# Patient Record
Sex: Female | Born: 1960 | Race: White | Hispanic: No | Marital: Married | Smoking: Never smoker
Health system: Southern US, Community
[De-identification: ages and names within clinical notes are randomized; demographics above are authoritative.]

## PROBLEM LIST (undated history)

## (undated) DIAGNOSIS — M1990 Unspecified osteoarthritis, unspecified site: Secondary | ICD-10-CM

## (undated) DIAGNOSIS — H9190 Unspecified hearing loss, unspecified ear: Secondary | ICD-10-CM

## (undated) DIAGNOSIS — Z8041 Family history of malignant neoplasm of ovary: Secondary | ICD-10-CM

## (undated) DIAGNOSIS — Z800 Family history of malignant neoplasm of digestive organs: Secondary | ICD-10-CM

## (undated) DIAGNOSIS — T4145XA Adverse effect of unspecified anesthetic, initial encounter: Secondary | ICD-10-CM

## (undated) DIAGNOSIS — Z87442 Personal history of urinary calculi: Secondary | ICD-10-CM

## (undated) DIAGNOSIS — G4730 Sleep apnea, unspecified: Secondary | ICD-10-CM

## (undated) DIAGNOSIS — C801 Malignant (primary) neoplasm, unspecified: Secondary | ICD-10-CM

## (undated) DIAGNOSIS — Z803 Family history of malignant neoplasm of breast: Secondary | ICD-10-CM

## (undated) DIAGNOSIS — F419 Anxiety disorder, unspecified: Secondary | ICD-10-CM

## (undated) DIAGNOSIS — E785 Hyperlipidemia, unspecified: Secondary | ICD-10-CM

## (undated) DIAGNOSIS — Z8049 Family history of malignant neoplasm of other genital organs: Secondary | ICD-10-CM

## (undated) DIAGNOSIS — T8859XA Other complications of anesthesia, initial encounter: Secondary | ICD-10-CM

## (undated) DIAGNOSIS — F329 Major depressive disorder, single episode, unspecified: Secondary | ICD-10-CM

## (undated) DIAGNOSIS — Z8719 Personal history of other diseases of the digestive system: Secondary | ICD-10-CM

## (undated) DIAGNOSIS — F32A Depression, unspecified: Secondary | ICD-10-CM

## (undated) DIAGNOSIS — R519 Headache, unspecified: Secondary | ICD-10-CM

## (undated) DIAGNOSIS — I10 Essential (primary) hypertension: Secondary | ICD-10-CM

## (undated) DIAGNOSIS — K219 Gastro-esophageal reflux disease without esophagitis: Secondary | ICD-10-CM

## (undated) HISTORY — DX: Sleep apnea, unspecified: G47.30

## (undated) HISTORY — DX: Depression, unspecified: F32.A

## (undated) HISTORY — PX: TUBAL LIGATION: SHX77

## (undated) HISTORY — DX: Family history of malignant neoplasm of ovary: Z80.41

## (undated) HISTORY — DX: Family history of malignant neoplasm of digestive organs: Z80.0

## (undated) HISTORY — PX: COLONOSCOPY: SHX174

## (undated) HISTORY — DX: Major depressive disorder, single episode, unspecified: F32.9

## (undated) HISTORY — PX: CHOLECYSTECTOMY: SHX55

## (undated) HISTORY — DX: Family history of malignant neoplasm of breast: Z80.3

## (undated) HISTORY — PX: OTHER SURGICAL HISTORY: SHX169

## (undated) HISTORY — DX: Essential (primary) hypertension: I10

## (undated) HISTORY — PX: TOTAL ANKLE REPLACEMENT: SUR1218

## (undated) HISTORY — PX: BREAST SURGERY: SHX581

## (undated) HISTORY — DX: Family history of malignant neoplasm of other genital organs: Z80.49

---

## 1995-02-20 HISTORY — PX: CHOLECYSTECTOMY: SHX55

## 2000-09-30 ENCOUNTER — Other Ambulatory Visit: Admission: RE | Admit: 2000-09-30 | Discharge: 2000-09-30 | Payer: Self-pay | Admitting: Obstetrics and Gynecology

## 2001-04-16 ENCOUNTER — Inpatient Hospital Stay (HOSPITAL_COMMUNITY): Admission: AD | Admit: 2001-04-16 | Discharge: 2001-04-20 | Payer: Self-pay | Admitting: Obstetrics and Gynecology

## 2001-04-21 ENCOUNTER — Encounter: Admission: RE | Admit: 2001-04-21 | Discharge: 2001-05-21 | Payer: Self-pay | Admitting: Obstetrics and Gynecology

## 2001-05-20 ENCOUNTER — Other Ambulatory Visit: Admission: RE | Admit: 2001-05-20 | Discharge: 2001-05-20 | Payer: Self-pay | Admitting: Obstetrics and Gynecology

## 2002-12-01 ENCOUNTER — Other Ambulatory Visit: Admission: RE | Admit: 2002-12-01 | Discharge: 2002-12-01 | Payer: Self-pay | Admitting: Obstetrics and Gynecology

## 2003-02-20 HISTORY — PX: NASAL SEPTUM SURGERY: SHX37

## 2003-03-26 ENCOUNTER — Ambulatory Visit (HOSPITAL_COMMUNITY): Admission: RE | Admit: 2003-03-26 | Discharge: 2003-03-26 | Payer: Self-pay | Admitting: Obstetrics and Gynecology

## 2003-11-20 ENCOUNTER — Ambulatory Visit: Payer: Self-pay | Admitting: Internal Medicine

## 2003-12-02 ENCOUNTER — Ambulatory Visit: Payer: Self-pay | Admitting: Otolaryngology

## 2003-12-21 ENCOUNTER — Ambulatory Visit: Payer: Self-pay | Admitting: Internal Medicine

## 2004-01-04 ENCOUNTER — Other Ambulatory Visit: Payer: Self-pay

## 2004-01-12 ENCOUNTER — Ambulatory Visit: Payer: Self-pay | Admitting: Otolaryngology

## 2004-05-18 ENCOUNTER — Ambulatory Visit: Payer: Self-pay | Admitting: Internal Medicine

## 2004-05-20 ENCOUNTER — Ambulatory Visit: Payer: Self-pay | Admitting: Internal Medicine

## 2004-06-29 ENCOUNTER — Ambulatory Visit: Payer: Self-pay | Admitting: Internal Medicine

## 2004-07-07 ENCOUNTER — Ambulatory Visit: Payer: Self-pay | Admitting: Rheumatology

## 2004-07-20 ENCOUNTER — Ambulatory Visit: Payer: Self-pay | Admitting: Internal Medicine

## 2004-08-24 ENCOUNTER — Ambulatory Visit: Payer: Self-pay | Admitting: Family Medicine

## 2004-09-06 ENCOUNTER — Ambulatory Visit: Payer: Self-pay | Admitting: Family Medicine

## 2005-07-09 ENCOUNTER — Other Ambulatory Visit: Payer: Self-pay

## 2005-07-26 ENCOUNTER — Ambulatory Visit: Payer: Self-pay | Admitting: Unknown Physician Specialty

## 2006-10-18 ENCOUNTER — Emergency Department: Payer: Self-pay | Admitting: Emergency Medicine

## 2010-02-19 HISTORY — PX: TOTAL ANKLE ARTHROPLASTY: SHX811

## 2010-10-10 ENCOUNTER — Ambulatory Visit: Payer: Self-pay | Admitting: Internal Medicine

## 2010-10-10 DIAGNOSIS — Z029 Encounter for administrative examinations, unspecified: Secondary | ICD-10-CM

## 2010-11-15 ENCOUNTER — Encounter: Payer: Self-pay | Admitting: Internal Medicine

## 2010-11-28 ENCOUNTER — Ambulatory Visit: Payer: Self-pay | Admitting: Internal Medicine

## 2010-12-01 ENCOUNTER — Ambulatory Visit: Payer: BC Managed Care – PPO | Admitting: Internal Medicine

## 2011-04-09 ENCOUNTER — Encounter: Payer: Self-pay | Admitting: Orthopaedic Surgery

## 2011-04-20 ENCOUNTER — Encounter: Payer: Self-pay | Admitting: Orthopaedic Surgery

## 2011-05-21 ENCOUNTER — Encounter: Payer: Self-pay | Admitting: Orthopaedic Surgery

## 2011-07-02 ENCOUNTER — Encounter: Payer: Self-pay | Admitting: Family Medicine

## 2011-07-02 ENCOUNTER — Ambulatory Visit (INDEPENDENT_AMBULATORY_CARE_PROVIDER_SITE_OTHER): Payer: BC Managed Care – PPO | Admitting: Family Medicine

## 2011-07-02 ENCOUNTER — Other Ambulatory Visit: Payer: Self-pay | Admitting: Family Medicine

## 2011-07-02 ENCOUNTER — Other Ambulatory Visit (HOSPITAL_COMMUNITY)
Admission: RE | Admit: 2011-07-02 | Discharge: 2011-07-02 | Disposition: A | Discharge status: Home / Self Care | Payer: BC Managed Care – PPO | Source: Ambulatory Visit | Attending: Family Medicine | Admitting: Family Medicine

## 2011-07-02 VITALS — BP 120/72 | HR 76 | Temp 98.1°F | Ht 64.25 in | Wt 229.0 lb

## 2011-07-02 DIAGNOSIS — Z1231 Encounter for screening mammogram for malignant neoplasm of breast: Secondary | ICD-10-CM

## 2011-07-02 DIAGNOSIS — Z01419 Encounter for gynecological examination (general) (routine) without abnormal findings: Secondary | ICD-10-CM | POA: Insufficient documentation

## 2011-07-02 DIAGNOSIS — F329 Major depressive disorder, single episode, unspecified: Secondary | ICD-10-CM

## 2011-07-02 DIAGNOSIS — I10 Essential (primary) hypertension: Secondary | ICD-10-CM

## 2011-07-02 DIAGNOSIS — Z1211 Encounter for screening for malignant neoplasm of colon: Secondary | ICD-10-CM

## 2011-07-02 DIAGNOSIS — Z0000 Encounter for general adult medical examination without abnormal findings: Secondary | ICD-10-CM | POA: Insufficient documentation

## 2011-07-02 DIAGNOSIS — D126 Benign neoplasm of colon, unspecified: Secondary | ICD-10-CM

## 2011-07-02 DIAGNOSIS — E119 Type 2 diabetes mellitus without complications: Secondary | ICD-10-CM

## 2011-07-02 DIAGNOSIS — K635 Polyp of colon: Secondary | ICD-10-CM | POA: Insufficient documentation

## 2011-07-02 DIAGNOSIS — Z136 Encounter for screening for cardiovascular disorders: Secondary | ICD-10-CM

## 2011-07-02 DIAGNOSIS — F32A Depression, unspecified: Secondary | ICD-10-CM | POA: Insufficient documentation

## 2011-07-02 LAB — LIPID PANEL
Cholesterol: 181 mg/dL (ref 0–200)
Total CHOL/HDL Ratio: 5
Triglycerides: 462 mg/dL — ABNORMAL HIGH (ref 0.0–149.0)
VLDL: 92.4 mg/dL — ABNORMAL HIGH (ref 0.0–40.0)

## 2011-07-02 LAB — COMPREHENSIVE METABOLIC PANEL
Alkaline Phosphatase: 72 U/L (ref 39–117)
BUN: 16 mg/dL (ref 6–23)
Glucose, Bld: 135 mg/dL — ABNORMAL HIGH (ref 70–99)
Total Bilirubin: 0.5 mg/dL (ref 0.3–1.2)

## 2011-07-02 LAB — LDL CHOLESTEROL, DIRECT: Direct LDL: 85.7 mg/dL

## 2011-07-02 MED ORDER — VENLAFAXINE HCL ER 150 MG PO CP24: 150.0000 mg | ORAL_CAPSULE | Freq: Every day | ORAL | Status: DC

## 2011-07-02 MED ORDER — METFORMIN HCL 1000 MG PO TABS: 500.0000 mg | ORAL_TABLET | Freq: Two times a day (BID) | ORAL | Status: DC

## 2011-07-02 MED ORDER — FENOFIBRATE 48 MG PO TABS: 48.0000 mg | ORAL_TABLET | Freq: Every day | ORAL | Status: DC

## 2011-07-02 NOTE — Progress Notes (Signed)
Subjective:    Patient ID: Sheena Ruiz, female    DOB: 1960-11-22, 51 y.o.   MRN: [id number]  HPI  81 G3P2 yo here to establish care and for CPX.  DM- has been on Metformin 500 mg twice daily for several years.  Has not checked her CBGs in months.  Denies any symptoms of hypoglycemia.  Not sure when her last a1c was checked.  HTN- has been stable on Benicar HCT 20-12.5 mg daily. Denies any HA, blurred vision, LE edema, HA or SOB.  Depression- stable on Effexor (brand only)- 150 mg daily. Denies any symptoms of anxiety or depression.  Colon polyps- per pt last colonoscopy was in 2008 or 2009.  Due for follow up colonoscopy.  Patient Active Problem List  Diagnoses  . Diabetes mellitus  . Depression  . Hypertension  . Colon polyps   Past Medical History  Diagnosis Date  . Diabetes mellitus   . Depression   . Hypertension    No past surgical history on file. History  Substance Use Topics  . Smoking status: Never Smoker   . Smokeless tobacco: Not on file  . Alcohol Use: Not on file   No family history on file. Allergies  Allergen Reactions  . Penicillins    Current Outpatient Prescriptions on File Prior to Visit  Medication Sig Dispense Refill  . metFORMIN (GLUCOPHAGE) 500 MG tablet Take 500 mg by mouth 2 (two) times daily with a meal.      . olmesartan-hydrochlorothiazide (BENICAR HCT) 20-12.5 MG per tablet Take 1 tablet by mouth daily.      Marland Kitchen venlafaxine XR (EFFEXOR-XR) 150 MG 24 hr capsule Take 1 capsule (150 mg total) by mouth daily. Name brand only  30 capsule  11   The PMH, PSH, Social History, Family History, Medications, and allergies have been reviewed in [facility], and have been updated if relevant.   Review of Systems See HPI Patient reports no  vision/ hearing changes,anorexia, weight change, fever ,adenopathy, persistant / recurrent hoarseness, swallowing issues, chest pain, edema,persistant / recurrent cough, hemoptysis, dyspnea(rest, exertional,  paroxysmal nocturnal), gastrointestinal  bleeding (melena, rectal bleeding), abdominal pain, excessive heart burn, GU symptoms(dysuria, hematuria, pyuria, voiding/incontinence  Issues) syncope, focal weakness, severe memory loss, concerning skin lesions, depression, anxiety, abnormal bruising/bleeding, major joint swelling, breast masses or abnormal vaginal bleeding.       Objective:   Physical Exam BP 120/72  Pulse 76  Temp(Src) 98.1 F (36.7 C) (Oral)  Ht 5' 4.25" (1.632 m)  Wt 229 lb (103.874 kg)  BMI 39.00 kg/m2  LMP 03/06/2011  General:  Well-developed,well-nourished,in no acute distress; alert,appropriate and cooperative throughout examination Head:  normocephalic and atraumatic.   Eyes:  vision grossly intact, pupils equal, pupils round, and pupils reactive to light.   Ears:  R ear normal and L ear normal.   Nose:  no external deformity.   Mouth:  good dentition.   Neck:  No deformities, masses, or tenderness noted. Breasts:  No mass, nodules, thickening, tenderness, bulging, retraction, inflamation, nipple discharge or skin changes noted.   Lungs:  Normal respiratory effort, chest expands symmetrically. Lungs are clear to auscultation, no crackles or wheezes. Heart:  Normal rate and regular rhythm. S1 and S2 normal without gallop, murmur, click, rub or other extra sounds. Abdomen:  Bowel sounds positive,abdomen soft and non-tender without masses, organomegaly or hernias noted. Rectal:  no external abnormalities.   Genitalia:  Pelvic Exam:        External: normal female genitalia  without lesions or masses        Vagina: normal without lesions or masses        Cervix: normal without lesions or masses        Adnexa: normal bimanual exam without masses or fullness        Uterus: normal by palpation        Pap smear: performed Msk:  No deformity or scoliosis noted of thoracic or lumbar spine.   Extremities:  No clubbing, cyanosis, edema, or deformity noted with normal full range  of motion of all joints.   Neurologic:  alert & oriented X3 and gait normal.   Skin:  Intact without suspicious lesions or rashes Cervical Nodes:  No lymphadenopathy noted Axillary Nodes:  No palpable lymphadenopathy Psych:  Cognition and judgment appear intact. Alert and cooperative with normal attention span and concentration. No apparent delusions, illusions, hallucinations     Assessment & Plan:   1. Hypertension  Stable on current dose of Benicar. Comprehensive metabolic panel   2. Diabetes mellitus    Advised checking CBGs a couple of times a week.  Check a1c today. Hemoglobin A1c  3. Depression  Stable, refilled Effexor.   4. Routine general medical examination at a health care facility   Reviewed preventive care protocols, scheduled due services, and updated immunizations Discussed nutrition, exercise, diet, and healthy lifestyle.  MM Digital Screening Pap smear Lipid Panel

## 2011-07-02 NOTE — Patient Instructions (Signed)
It was great to meet you. Please stop by to see Shirlee Limerick on your way out to set up your mammogram and colonoscopy. We will call with results of your blood work from today.

## 2011-07-05 ENCOUNTER — Encounter: Payer: Self-pay | Admitting: Family Medicine

## 2011-08-14 ENCOUNTER — Ambulatory Visit: Payer: Self-pay | Admitting: Family Medicine

## 2011-08-15 ENCOUNTER — Encounter: Payer: Self-pay | Admitting: Family Medicine

## 2011-08-16 ENCOUNTER — Encounter: Payer: Self-pay | Admitting: Family Medicine

## 2011-08-16 ENCOUNTER — Encounter: Payer: Self-pay | Admitting: *Deleted

## 2011-08-29 ENCOUNTER — Ambulatory Visit: Payer: BC Managed Care – PPO | Admitting: Family Medicine

## 2011-09-27 ENCOUNTER — Other Ambulatory Visit: Payer: Self-pay | Admitting: Family Medicine

## 2011-09-27 DIAGNOSIS — E119 Type 2 diabetes mellitus without complications: Secondary | ICD-10-CM

## 2011-09-27 DIAGNOSIS — E7800 Pure hypercholesterolemia, unspecified: Secondary | ICD-10-CM

## 2011-09-27 DIAGNOSIS — I10 Essential (primary) hypertension: Secondary | ICD-10-CM

## 2011-10-04 ENCOUNTER — Encounter: Payer: Self-pay | Admitting: Family Medicine

## 2011-10-04 ENCOUNTER — Other Ambulatory Visit: Payer: BC Managed Care – PPO

## 2011-12-29 ENCOUNTER — Emergency Department: Payer: Self-pay | Admitting: Emergency Medicine

## 2012-04-23 ENCOUNTER — Telehealth: Payer: Self-pay | Admitting: Family Medicine

## 2012-04-23 NOTE — Telephone Encounter (Signed)
Lupita Leash left vm for pt to return call, pt to schedule an appt just to discuss meds and preventative services per Dr. Dan Humphreys.

## 2012-05-07 NOTE — Telephone Encounter (Signed)
Not a Dr. Dan Humphreys patient

## 2012-07-11 ENCOUNTER — Other Ambulatory Visit: Payer: Self-pay

## 2012-07-11 MED ORDER — VENLAFAXINE HCL ER 150 MG PO CP24: 150.0000 mg | ORAL_CAPSULE | Freq: Every day | ORAL | Status: DC

## 2012-07-11 NOTE — Telephone Encounter (Signed)
Sheena Ruiz with [employer] left v/m requesting refill venlafaxine XR 150 mg (pt refills has expired). Pt out of med. Pt last seen 07/02/11; pt has CPX scheduled 08/07/12.Please advise.

## 2012-08-04 ENCOUNTER — Other Ambulatory Visit: Payer: BC Managed Care – PPO

## 2012-08-07 ENCOUNTER — Encounter: Payer: BC Managed Care – PPO | Admitting: Family Medicine

## 2012-08-08 ENCOUNTER — Other Ambulatory Visit: Payer: Self-pay | Admitting: Family Medicine

## 2012-08-08 NOTE — Telephone Encounter (Signed)
Pt left v/m to ck status of refill; advised via v/m refill done.

## 2012-10-16 ENCOUNTER — Encounter: Payer: BC Managed Care – PPO | Admitting: Family Medicine

## 2012-10-17 ENCOUNTER — Ambulatory Visit: Payer: BC Managed Care – PPO | Admitting: Family Medicine

## 2012-10-23 ENCOUNTER — Ambulatory Visit (INDEPENDENT_AMBULATORY_CARE_PROVIDER_SITE_OTHER): Payer: BC Managed Care – PPO | Admitting: Family Medicine

## 2012-10-23 ENCOUNTER — Encounter: Payer: Self-pay | Admitting: Family Medicine

## 2012-10-23 VITALS — BP 122/60 | HR 89 | Temp 98.5°F | Ht 64.25 in | Wt 222.0 lb

## 2012-10-23 DIAGNOSIS — E781 Pure hyperglyceridemia: Secondary | ICD-10-CM

## 2012-10-23 DIAGNOSIS — E785 Hyperlipidemia, unspecified: Secondary | ICD-10-CM | POA: Insufficient documentation

## 2012-10-23 DIAGNOSIS — Z1231 Encounter for screening mammogram for malignant neoplasm of breast: Secondary | ICD-10-CM

## 2012-10-23 DIAGNOSIS — E119 Type 2 diabetes mellitus without complications: Secondary | ICD-10-CM

## 2012-10-23 DIAGNOSIS — I10 Essential (primary) hypertension: Secondary | ICD-10-CM

## 2012-10-23 DIAGNOSIS — F329 Major depressive disorder, single episode, unspecified: Secondary | ICD-10-CM

## 2012-10-23 LAB — COMPREHENSIVE METABOLIC PANEL
AST: 22 U/L (ref 0–37)
Alkaline Phosphatase: 78 U/L (ref 39–117)
BUN: 11 mg/dL (ref 6–23)
Creatinine, Ser: 0.6 mg/dL (ref 0.4–1.2)
Total Bilirubin: 0.5 mg/dL (ref 0.3–1.2)

## 2012-10-23 LAB — LIPID PANEL
Cholesterol: 207 mg/dL — ABNORMAL HIGH (ref 0–200)
Total CHOL/HDL Ratio: 6
Triglycerides: 495 mg/dL — ABNORMAL HIGH (ref 0.0–149.0)
VLDL: 99 mg/dL — ABNORMAL HIGH (ref 0.0–40.0)

## 2012-10-23 LAB — LDL CHOLESTEROL, DIRECT: Direct LDL: 98.3 mg/dL

## 2012-10-23 MED ORDER — EFFEXOR XR 150 MG PO CP24: ORAL_CAPSULE | ORAL | Status: DC

## 2012-10-23 NOTE — Progress Notes (Signed)
Subjective:    Patient ID: Sheena Ruiz, female    DOB: November 18, 1960, 52 y.o.   MRN: [id number]  HPI  24 G3P2 yo here for "rx refills."  Has not been seen since she established care over a year ago.  DM- has been on Metformin 1000 mg twice daily since she established care with me in 06/2011.  I advised follow up a1c in 09/2011 but she was lost to follow up.  Has not checked her CBGs in months.  Denies any symptoms of hypoglycemia.  Lab Results  Component Value Date   HGBA1C 8.2* 07/02/2011   Hypertriglyceridemia- also lost to follow up- see Epic.  Given rx for tricor and she states that she stopped taking it because she felt bad.  Cannot remember how it made her feel bad- she thinks GI side effects. Lab Results  Component Value Date   CHOL 181 07/02/2011   HDL 39.10 07/02/2011   LDLDIRECT 85.7 07/02/2011   TRIG 462.0* 07/02/2011   CHOLHDL 5 07/02/2011    HTN- has been stable on Benicar HCT 20-12.5 mg daily. Denies any HA, blurred vision, LE edema, HA or SOB.  Lab Results  Component Value Date   CREATININE 0.8 07/02/2011    Depression- stable on Effexor (brand only)- 150 mg daily. Denies any symptoms of anxiety or depression.   Patient Active Problem List   Diagnosis Date Noted  . Colon polyps 07/02/2011  . Type II or unspecified type diabetes mellitus without mention of complication, not stated as uncontrolled   . Depression   . Hypertension    Past Medical History  Diagnosis Date  . Diabetes mellitus   . Depression   . Hypertension    Past Surgical History  Procedure Laterality Date  . Ankle replacement     History  Substance Use Topics  . Smoking status: Never Smoker   . Smokeless tobacco: Not on file  . Alcohol Use: Not on file   No family history on file. Allergies  Allergen Reactions  . Penicillins    Current Outpatient Prescriptions on File Prior to Visit  Medication Sig Dispense Refill  . EFFEXOR XR 150 MG 24 hr capsule take 1 capsule by mouth daily   30 capsule  2  . fenofibrate (TRICOR) 48 MG tablet Take 1 tablet (48 mg total) by mouth daily.  30 tablet  3  . metFORMIN (GLUCOPHAGE) 1000 MG tablet Take 0.5 tablets (500 mg total) by mouth 2 (two) times daily with a meal.  60 tablet  3  . Multiple Vitamin (MULTIVITAMIN) tablet Take 1 tablet by mouth daily.      Marland Kitchen olmesartan-hydrochlorothiazide (BENICAR HCT) 20-12.5 MG per tablet Take 1 tablet by mouth daily.       No current facility-administered medications on file prior to visit.   The PMH, PSH, Social History, Family History, Medications, and allergies have been reviewed in [facility], and have been updated if relevant.   Review of Systems See HPI     Objective:   Physical Exam BP 122/60  Pulse 89  Temp(Src) 98.5 F (36.9 C)  Ht 5' 4.25" (1.632 m)  Wt 222 lb (100.699 kg)  BMI 37.81 kg/m2  LMP 03/06/2011  General:  Well-developed,well-nourished,in no acute distress; alert,appropriate and cooperative throughout examination Head:  normocephalic and atraumatic.   Eyes:  vision grossly intact, pupils equal, pupils round, and pupils reactive to light.   Neurologic:  alert & oriented X3 and gait normal.   Skin:  Intact without suspicious  lesions or rashes Psych:  Cognition and judgment appear intact. Alert and cooperative with normal attention span and concentration. No apparent delusions, illusions, hallucinations     Assessment & Plan:   1. Hypertension Stable. - Lipid Panel - Comprehensive metabolic panel  2. Type II or unspecified type diabetes mellitus without mention of complication, not stated as uncontrolled Lost to follow up.  Recheck a1c today. - Hemoglobin A1c  3. Depression Stable on current dose of Effexor.  Rx refilled.  4. Hypertriglyceridemia Recheck lipids today.   - Lipid Panel  5. Other screening mammogram  - MM Digital Screening; Future

## 2012-10-23 NOTE — Patient Instructions (Addendum)
Good to see you. We will call you with your lab results and you can view them online.  Ok to use two acute slots to book a complete physical before my maternity leave.

## 2012-10-29 ENCOUNTER — Other Ambulatory Visit: Payer: BC Managed Care – PPO

## 2012-11-03 ENCOUNTER — Ambulatory Visit (INDEPENDENT_AMBULATORY_CARE_PROVIDER_SITE_OTHER): Payer: BC Managed Care – PPO | Admitting: Family Medicine

## 2012-11-03 ENCOUNTER — Encounter: Payer: Self-pay | Admitting: Family Medicine

## 2012-11-03 VITALS — BP 140/74 | HR 76 | Temp 98.2°F | Ht 64.25 in | Wt 222.0 lb

## 2012-11-03 DIAGNOSIS — E119 Type 2 diabetes mellitus without complications: Secondary | ICD-10-CM

## 2012-11-03 DIAGNOSIS — I10 Essential (primary) hypertension: Secondary | ICD-10-CM

## 2012-11-03 DIAGNOSIS — D126 Benign neoplasm of colon, unspecified: Secondary | ICD-10-CM

## 2012-11-03 DIAGNOSIS — F329 Major depressive disorder, single episode, unspecified: Secondary | ICD-10-CM

## 2012-11-03 DIAGNOSIS — E781 Pure hyperglyceridemia: Secondary | ICD-10-CM

## 2012-11-03 DIAGNOSIS — Z0000 Encounter for general adult medical examination without abnormal findings: Secondary | ICD-10-CM | POA: Insufficient documentation

## 2012-11-03 DIAGNOSIS — K635 Polyp of colon: Secondary | ICD-10-CM

## 2012-11-03 NOTE — Patient Instructions (Addendum)
Good to see you. Today we discussed either restarting Metformin or starting Glyburide or Glipizide.  For your triglycerides, we discussed Tricor or fenofibrate.  We will call you with your nutritionist referral.  Please call me to decide on which medications you would like to start.

## 2012-11-03 NOTE — Progress Notes (Signed)
Subjective:    Patient ID: Sheena Ruiz, female    DOB: 1960/06/24, 52 y.o.   MRN: [id number]  HPI  89 G3P2 yo here for CPX.    Mammogram scheduled.  Pap smear was normal last year - 06/2011.  DM- poorly controlled.   Has been on Metformin 1000 mg twice daily since she established care with me in 06/2011 but admits to not taking it.  Did cause some cramping and lose stools.   Lab Results  Component Value Date   HGBA1C 9.6* 10/23/2012   Hypertriglyceridemia-.  Given rx for tricor and she states that she stopped taking it because she felt bad.  Cannot remember how it made her feel bad- she thinks GI side effects. Lab Results  Component Value Date   CHOL 207* 10/23/2012   HDL 34.50* 10/23/2012   LDLDIRECT 98.3 10/23/2012   TRIG 495.0 Triglyceride is over 400; calculations on Lipids are invalid.* 10/23/2012   CHOLHDL 6 10/23/2012    HTN- has been stable on Benicar HCT 20-12.5 mg daily. Denies any HA, blurred vision, LE edema, HA or SOB.  Lab Results  Component Value Date   CREATININE 0.6 10/23/2012   Depression- stable on Effexor (brand only)- 150 mg daily. Denies any symptoms of anxiety or depression.   Patient Active Problem List   Diagnosis Date Noted  . Routine general medical examination at a health care facility 11/03/2012  . Hypertriglyceridemia 10/23/2012  . Colon polyps 07/02/2011  . Type II or unspecified type diabetes mellitus without mention of complication, not stated as uncontrolled   . Depression   . Hypertension    Past Medical History  Diagnosis Date  . Diabetes mellitus   . Depression   . Hypertension    Past Surgical History  Procedure Laterality Date  . Ankle replacement     History  Substance Use Topics  . Smoking status: Never Smoker   . Smokeless tobacco: Not on file  . Alcohol Use: Not on file   No family history on file. Allergies  Allergen Reactions  . Penicillins    Current Outpatient Prescriptions on File Prior to Visit  Medication Sig  Dispense Refill  . EFFEXOR XR 150 MG 24 hr capsule take 1 capsule by mouth daily  30 capsule  2  . fenofibrate (TRICOR) 48 MG tablet Take 1 tablet (48 mg total) by mouth daily.  30 tablet  3  . metFORMIN (GLUCOPHAGE) 1000 MG tablet Take 0.5 tablets (500 mg total) by mouth 2 (two) times daily with a meal.  60 tablet  3  . Multiple Vitamin (MULTIVITAMIN) tablet Take 1 tablet by mouth daily.      Marland Kitchen olmesartan-hydrochlorothiazide (BENICAR HCT) 20-12.5 MG per tablet Take 1 tablet by mouth daily.       No current facility-administered medications on file prior to visit.   The PMH, PSH, Social History, Family History, Medications, and allergies have been reviewed in [facility], and have been updated if relevant.   Review of Systems See HPI Patient reports no  vision/ hearing changes,anorexia, weight change, fever ,adenopathy, persistant / recurrent hoarseness, swallowing issues, chest pain, edema,persistant / recurrent cough, hemoptysis, dyspnea(rest, exertional, paroxysmal nocturnal), gastrointestinal  bleeding (melena, rectal bleeding), abdominal pain, excessive heart burn, GU symptoms(dysuria, hematuria, pyuria, voiding/incontinence  Issues) syncope, focal weakness, severe memory loss, concerning skin lesions, depression, anxiety, abnormal bruising/bleeding, major joint swelling, breast masses or abnormal vaginal bleeding.       Objective:   Physical Exam LMP 03/06/2011 BP 140/74  Pulse 76  Temp(Src) 98.2 F (36.8 C)  Ht 5' 4.25" (1.632 m)  Wt 222 lb (100.699 kg)  BMI 37.81 kg/m2  LMP 03/06/2011 Wt Readings from Last 3 Encounters:  11/03/12 222 lb (100.699 kg)  10/23/12 222 lb (100.699 kg)  07/02/11 229 lb (103.874 kg)    General:  Well-developed,well-nourished,in no acute distress; alert,appropriate and cooperative throughout examination Head:  normocephalic and atraumatic.   Eyes:  vision grossly intact, pupils equal, pupils round, and pupils reactive to light.   Ears:  R ear normal and L  ear normal.   Nose:  no external deformity.   Mouth:  good dentition.   lungs:  Normal respiratory effort, chest expands symmetrically. Lungs are clear to auscultation, no crackles or wheezes. Heart:  Normal rate and regular rhythm. S1 and S2 normal without gallop, murmur, click, rub or other extra sounds. Msk:  No deformity or scoliosis noted of thoracic or lumbar spine.   Extremities:  No clubbing, cyanosis, edema, or deformity noted with normal full range of motion of all joints.   Neurologic:  alert & oriented X3 and gait normal.   Skin:  Intact without suspicious lesions or rashes Cervical Nodes:  No lymphadenopathy noted Axillary Nodes:  No palpable lymphadenopathy Psych:  Cognition and judgment appear intact. Alert and cooperative with normal attention span and concentration. No apparent delusions, illusions, hallucinations   Diabetic foot exam: Normal inspection No skin breakdown No calluses  Normal DP pulses Normal sensation to light touch and monofilament Nails normal     Assessment & Plan:   1. Routine general medical examination at a health care facility Reviewed preventive care protocols, scheduled due services, and updated immunizations Discussed nutrition, exercise, diet, and healthy lifestyle.   2. Colon polyps UTD colonoscopy. Denies changes in bowel habits. 3. Depression Stable on current dose of effexor.  No changes. 4. Hypertension Stable on Benicar. 5. Hypertriglyceridemia Deteriorated- fasting labs done at husbands office, TG remained high at 496.  Did not tolerate Tricor. Discussed if she restarted Metformin and changed her diet, we could improve her TG. Referred to diabetic nutritionist. The patient indicates understanding of these issues and agrees with the plan.   6. Type II or unspecified type diabetes mellitus without mention of complication, not stated as uncontrolled Deteriorated. Discussed tx options.  She will discuss with her husband who  is an MD and get back to me.

## 2012-11-18 ENCOUNTER — Ambulatory Visit
Admission: RE | Admit: 2012-11-18 | Discharge: 2012-11-18 | Disposition: A | Discharge status: Home / Self Care | Payer: BC Managed Care – PPO | Source: Ambulatory Visit | Attending: Family Medicine | Admitting: Family Medicine

## 2012-11-18 DIAGNOSIS — Z1231 Encounter for screening mammogram for malignant neoplasm of breast: Secondary | ICD-10-CM

## 2012-12-05 ENCOUNTER — Telehealth: Payer: Self-pay | Admitting: Family Medicine

## 2012-12-05 NOTE — Telephone Encounter (Signed)
Pt did not state why she called. She wanted Dr. Dayton Martes or Dr. Elmer Sow assistant to call her back.

## 2012-12-05 NOTE — Telephone Encounter (Signed)
Pt cancelled 12/11/12 appt and pt was to let Dr Dayton Martes know what diabetic med she wanted to take; pt said BS during the day are below 150 with pt watching her diet. Pt would like to try dieting to see how she does and will cb if needed before her regular 3 month appt.

## 2012-12-08 NOTE — Telephone Encounter (Signed)
Noted and I'm happy she is making lifestyle changes.  Lab Results  Component Value Date   HGBA1C 9.6* 10/23/2012   Given her very elevated a1c though, I would still recommend medication even if only for short term.

## 2012-12-08 NOTE — Telephone Encounter (Signed)
Left message on answering machine to call back.

## 2012-12-09 NOTE — Telephone Encounter (Signed)
Patient notified as instructed by telephone. Was advised by patient that she does not want to take any diabetic medication now. Patient states that her FBS in the am has been running 98 and after a meal 130.

## 2012-12-09 NOTE — Telephone Encounter (Signed)
noted 

## 2012-12-11 ENCOUNTER — Ambulatory Visit: Payer: BC Managed Care – PPO | Admitting: Family Medicine

## 2012-12-17 ENCOUNTER — Telehealth: Payer: Self-pay

## 2012-12-17 NOTE — Telephone Encounter (Signed)
Pt wants to know why she was only given 3 refills on Effexor since she had been on med for a long time. Advised pt [place] policy for prescribing meds for depression and pt wanted to know how often she would be coming in to see Dr Dayton Martes. Pt already has appt to see Dr Dayton Martes in 02/2013 and pt will discuss with Dr Dayton Martes about frequency of visits at that time. If pt needs anything else she will contact our office.

## 2013-02-06 ENCOUNTER — Other Ambulatory Visit: Payer: Self-pay | Admitting: Family Medicine

## 2013-03-02 ENCOUNTER — Ambulatory Visit (INDEPENDENT_AMBULATORY_CARE_PROVIDER_SITE_OTHER): Payer: BC Managed Care – PPO | Admitting: Family Medicine

## 2013-03-02 ENCOUNTER — Encounter: Payer: Self-pay | Admitting: Family Medicine

## 2013-03-02 VITALS — BP 136/66 | HR 90 | Temp 98.7°F | Ht 64.0 in | Wt 202.0 lb

## 2013-03-02 DIAGNOSIS — E119 Type 2 diabetes mellitus without complications: Secondary | ICD-10-CM

## 2013-03-02 DIAGNOSIS — I10 Essential (primary) hypertension: Secondary | ICD-10-CM

## 2013-03-02 DIAGNOSIS — E781 Pure hyperglyceridemia: Secondary | ICD-10-CM

## 2013-03-02 NOTE — Assessment & Plan Note (Signed)
Improved with diet  

## 2013-03-02 NOTE — Patient Instructions (Signed)
I am so proud of you, Sheena Ruiz. Please send me your labs (or fax them) in 3 months. I will call you.

## 2013-03-02 NOTE — Progress Notes (Signed)
Subjective:    Patient ID: Sheena Ruiz, female    DOB: Jun 22, 1960, 53 y.o.   MRN: [id number]  HPI  53 yo female here for 3 month follow up.  She brings in labs with her done at her husband's office.   DM- poorly controlled.   Had been on Metformin 1000 mg twice daily but stopped taking it.  Refused to start another medication.    Wanted to do it with diet alone and she has!!! a1c now 6.9.  She has lost 20 pounds!  CBGs usually 80 fasting.   Lab Results  Component Value Date   HGBA1C 9.6* 10/23/2012   Hypertriglyceridemia-.  Given rx for tricor and she states that she stopped taking it because she felt bad.  Cannot remember how it made her feel bad- she thinks GI side effects. TG now 279!! Lab Results  Component Value Date   CHOL 207* 10/23/2012   HDL 34.50* 10/23/2012   LDLDIRECT 98.3 10/23/2012   TRIG 495.0 Triglyceride is over 400; calculations on Lipids are invalid.* 10/23/2012   CHOLHDL 6 10/23/2012    HTN- has been stable on Benicar HCT 20-12.5 mg daily. Denies any HA, blurred vision, LE edema, HA or SOB.  Lab Results  Component Value Date   CREATININE 0.6 10/23/2012    Patient Active Problem List   Diagnosis Date Noted  . Hypertriglyceridemia 10/23/2012  . Colon polyps 07/02/2011  . Type II or unspecified type diabetes mellitus without mention of complication, not stated as uncontrolled   . Depression   . Hypertension    Past Medical History  Diagnosis Date  . Diabetes mellitus   . Depression   . Hypertension    Past Surgical History  Procedure Laterality Date  . Ankle replacement     History  Substance Use Topics  . Smoking status: Never Smoker   . Smokeless tobacco: Not on file  . Alcohol Use: Not on file   No family history on file. Allergies  Allergen Reactions  . Penicillins   . Tricor [Fenofibrate]     "felt bad and heavy."   Current Outpatient Prescriptions on File Prior to Visit  Medication Sig Dispense Refill  . COCONUT OIL PO Take by mouth.       . EFFEXOR XR 150 MG 24 hr capsule take 1 capsule by mouth daily  30 capsule  2  . EFFEXOR XR 150 MG 24 hr capsule take 1 capsule by mouth once daily  30 capsule  2  . Flaxseed, Linseed, (FLAX SEED OIL PO) Take one by mouth daily      . L-THEANINE PO Take two by mouth daily      . metFORMIN (GLUCOPHAGE) 1000 MG tablet Take 0.5 tablets (500 mg total) by mouth 2 (two) times daily with a meal.  60 tablet  3  . Multiple Vitamin (MULTIVITAMIN) tablet Take 1 tablet by mouth daily.      . Nutritional Supplements (JUICE PLUS FIBRE PO) Take 2 by mouth daily      . olmesartan-hydrochlorothiazide (BENICAR HCT) 20-12.5 MG per tablet Take 1 tablet by mouth daily.      . Plant Sterols and Stanols (CHOLESTOFF) 450 MG TABS Take 2 by mouth three times daily with meals       No current facility-administered medications on file prior to visit.   The PMH, PSH, Social History, Family History, Medications, and allergies have been reviewed in [facility], and have been updated if relevant.   Review of Systems  See HPI Patient reports no  vision/ hearing changes,anorexia, weight change, fever ,adenopathy, persistant / recurrent hoarseness, swallowing issues, chest pain, edema,persistant / recurrent cough, hemoptysis, dyspnea(rest, exertional, paroxysmal nocturnal), gastrointestinal  bleeding (melena, rectal bleeding), abdominal pain, excessive heart burn, GU symptoms(dysuria, hematuria, pyuria, voiding/incontinence  Issues) syncope, focal weakness, severe memory loss, concerning skin lesions, depression, anxiety, abnormal bruising/bleeding, major joint swelling, breast masses or abnormal vaginal bleeding.       Objective:   Physical Exam BP 136/66  Pulse 90  Temp(Src) 98.7 F (37.1 C) (Oral)  Ht 5\' 4"  (1.626 m)  Wt 202 lb (91.627 kg)  BMI 34.66 kg/m2  SpO2 97%  LMP 03/06/2011 Wt Readings from Last 3 Encounters:  03/02/13 202 lb (91.627 kg)  11/03/12 222 lb (100.699 kg)  10/23/12 222 lb (100.699 kg)   General:   Well-developed,well-nourished,in no acute distress; alert,appropriate and cooperative throughout examination Head:  normocephalic and atraumatic.   Eyes:  vision grossly intact, pupils equal, pupils round, and pupils reactive to light.   Ears:  R ear normal and L ear normal.   Nose:  no external deformity.   Mouth:  good dentition.   lungs:  Normal respiratory effort, chest expands symmetrically. Lungs are clear to auscultation, no crackles or wheezes. Heart:  Normal rate and regular rhythm. S1 and S2 normal without gallop, murmur, click, rub or other extra sounds. Msk:  No deformity or scoliosis noted of thoracic or lumbar spine.   Extremities:  No clubbing, cyanosis, edema, or deformity noted with normal full range of motion of all joints.   Neurologic:  alert & oriented X3 and gait normal.   Skin:  Intact without suspicious lesions or rashes Cervical Nodes:  No lymphadenopathy noted Axillary Nodes:  No palpable lymphadenopathy Psych:  Cognition and judgment appear intact. Alert and cooperative with normal attention span and concentration. No apparent delusions, illusions, hallucinations      Assessment & Plan:

## 2013-03-02 NOTE — Assessment & Plan Note (Signed)
Well controlled. No changes. 

## 2013-03-02 NOTE — Assessment & Plan Note (Signed)
At goal with diet alone! She wants to keep losing weight- continue with low fat, low carb diet.  Will increase exercise.

## 2013-03-02 NOTE — Progress Notes (Signed)
Pre-visit discussion using our clinic review tool. No additional management support is needed unless otherwise documented below in the visit note.  

## 2013-03-03 ENCOUNTER — Telehealth: Payer: Self-pay | Admitting: Family Medicine

## 2013-03-03 ENCOUNTER — Telehealth: Payer: Self-pay

## 2013-03-03 NOTE — Telephone Encounter (Signed)
Relevant patient education assigned to patient using Emmi. ° °

## 2013-05-11 ENCOUNTER — Other Ambulatory Visit: Payer: Self-pay | Admitting: *Deleted

## 2013-05-11 NOTE — Telephone Encounter (Signed)
Fax refill request, Rout to PCP 

## 2013-05-12 MED ORDER — EFFEXOR XR 150 MG PO CP24: ORAL_CAPSULE | ORAL | Status: DC

## 2013-05-28 ENCOUNTER — Other Ambulatory Visit: Payer: Self-pay

## 2013-09-04 ENCOUNTER — Telehealth: Payer: Self-pay | Admitting: Family Medicine

## 2013-09-04 NOTE — Telephone Encounter (Signed)
Diabetic Bundle.  Pt needs A1C check in September.  Pt will call our office back to schedule.

## 2013-11-27 ENCOUNTER — Other Ambulatory Visit: Payer: Self-pay

## 2013-11-27 DIAGNOSIS — Z1239 Encounter for other screening for malignant neoplasm of breast: Secondary | ICD-10-CM

## 2013-12-07 ENCOUNTER — Other Ambulatory Visit: Payer: Self-pay | Admitting: Family Medicine

## 2013-12-07 DIAGNOSIS — E119 Type 2 diabetes mellitus without complications: Secondary | ICD-10-CM

## 2013-12-07 DIAGNOSIS — I10 Essential (primary) hypertension: Secondary | ICD-10-CM

## 2013-12-07 DIAGNOSIS — E781 Pure hyperglyceridemia: Secondary | ICD-10-CM

## 2013-12-08 ENCOUNTER — Other Ambulatory Visit: Payer: BC Managed Care – PPO

## 2013-12-08 ENCOUNTER — Other Ambulatory Visit: Payer: Self-pay

## 2013-12-08 DIAGNOSIS — Z1231 Encounter for screening mammogram for malignant neoplasm of breast: Secondary | ICD-10-CM

## 2013-12-10 ENCOUNTER — Ambulatory Visit: Payer: BC Managed Care – PPO

## 2013-12-14 ENCOUNTER — Other Ambulatory Visit (INDEPENDENT_AMBULATORY_CARE_PROVIDER_SITE_OTHER): Payer: BC Managed Care – PPO

## 2013-12-14 DIAGNOSIS — E119 Type 2 diabetes mellitus without complications: Secondary | ICD-10-CM

## 2013-12-14 DIAGNOSIS — E781 Pure hyperglyceridemia: Secondary | ICD-10-CM

## 2013-12-14 LAB — COMPREHENSIVE METABOLIC PANEL
ALK PHOS: 75 U/L (ref 39–117)
ALT: 19 U/L (ref 0–35)
AST: 22 U/L (ref 0–37)
Albumin: 3.4 g/dL — ABNORMAL LOW (ref 3.5–5.2)
BILIRUBIN TOTAL: 0.6 mg/dL (ref 0.2–1.2)
BUN: 11 mg/dL (ref 6–23)
CO2: 28 meq/L (ref 19–32)
CREATININE: 0.7 mg/dL (ref 0.4–1.2)
Calcium: 9.2 mg/dL (ref 8.4–10.5)
Chloride: 102 mEq/L (ref 96–112)
GFR: 91.07 mL/min (ref >60.00)
GLUCOSE: 169 mg/dL — AB (ref 70–99)
Potassium: 3.9 mEq/L (ref 3.5–5.1)
Sodium: 137 mEq/L (ref 135–145)
Total Protein: 7.5 g/dL (ref 6.0–8.3)

## 2013-12-14 LAB — MICROALBUMIN / CREATININE URINE RATIO
CREATININE, U: 272.1 mg/dL
Microalb Creat Ratio: 3.5 mg/g (ref 0.0–30.0)
Microalb, Ur: 9.4 mg/dL — ABNORMAL HIGH (ref 0.0–1.9)

## 2013-12-14 LAB — LIPID PANEL
CHOLESTEROL: 223 mg/dL — AB (ref 0–200)
HDL: 31.6 mg/dL — AB (ref >39.00)
NonHDL: 191.4
TRIGLYCERIDES: 238 mg/dL — AB (ref 0.0–149.0)
Total CHOL/HDL Ratio: 7
VLDL: 47.6 mg/dL — AB (ref 0.0–40.0)

## 2013-12-14 LAB — HEMOGLOBIN A1C: Hgb A1c MFr Bld: 8 % — ABNORMAL HIGH (ref 4.6–6.5)

## 2013-12-14 LAB — LDL CHOLESTEROL, DIRECT: LDL DIRECT: 144.4 mg/dL

## 2013-12-15 ENCOUNTER — Encounter: Payer: Self-pay | Admitting: Family Medicine

## 2013-12-15 ENCOUNTER — Ambulatory Visit (INDEPENDENT_AMBULATORY_CARE_PROVIDER_SITE_OTHER): Payer: BC Managed Care – PPO | Admitting: Family Medicine

## 2013-12-15 ENCOUNTER — Other Ambulatory Visit (HOSPITAL_COMMUNITY)
Admission: RE | Admit: 2013-12-15 | Discharge: 2013-12-15 | Disposition: A | Discharge status: Home / Self Care | Payer: BC Managed Care – PPO | Source: Ambulatory Visit | Attending: Family Medicine | Admitting: Family Medicine

## 2013-12-15 VITALS — BP 132/82 | HR 67 | Temp 98.2°F | Ht 64.0 in | Wt 211.0 lb

## 2013-12-15 DIAGNOSIS — Z1151 Encounter for screening for human papillomavirus (HPV): Secondary | ICD-10-CM | POA: Diagnosis present

## 2013-12-15 DIAGNOSIS — F329 Major depressive disorder, single episode, unspecified: Secondary | ICD-10-CM

## 2013-12-15 DIAGNOSIS — K635 Polyp of colon: Secondary | ICD-10-CM

## 2013-12-15 DIAGNOSIS — E119 Type 2 diabetes mellitus without complications: Secondary | ICD-10-CM

## 2013-12-15 DIAGNOSIS — F32A Depression, unspecified: Secondary | ICD-10-CM

## 2013-12-15 DIAGNOSIS — Z23 Encounter for immunization: Secondary | ICD-10-CM

## 2013-12-15 DIAGNOSIS — Z01419 Encounter for gynecological examination (general) (routine) without abnormal findings: Secondary | ICD-10-CM | POA: Insufficient documentation

## 2013-12-15 DIAGNOSIS — E785 Hyperlipidemia, unspecified: Secondary | ICD-10-CM

## 2013-12-15 DIAGNOSIS — I10 Essential (primary) hypertension: Secondary | ICD-10-CM

## 2013-12-15 MED ORDER — VENLAFAXINE HCL ER 150 MG PO TB24: ORAL_TABLET | ORAL | Status: DC

## 2013-12-15 MED ORDER — METFORMIN HCL 500 MG PO TABS: 500.0000 mg | ORAL_TABLET | Freq: Two times a day (BID) | ORAL | Status: DC

## 2013-12-15 NOTE — Assessment & Plan Note (Signed)
Well controlled and at goal for a diabetic.

## 2013-12-15 NOTE — Progress Notes (Signed)
Subjective:    Patient ID: Sheena Ruiz, female    DOB: 1960-02-27, 53 y.o.   MRN: [id number]  HPI  65 G3P2 yo here for CPX.     Pap smear was normal  06/2011. No post menopausal bleeding Colonoscopy 09/19/11- Dr. Vira Agar- 5 year recall Mammogram 11/19/2012- she is scheduling this.  DM- poorly controlled.   Was taking Metformin 1000 mg twice daily when she established care with me in 06/2011 but was not taking it since it caused some cramping and loose stools.  Wanted to manage her DM with diet alone and she was successful in 02/2013, a1c was 6.9.  Unfortunately, she has regained some weight and her DM has deteriorated.  She admits to eating more sugars.  She has been exercising daily though. Lab Results  Component Value Date   HGBA1C 8.0* 12/14/2013   Wt Readings from Last 3 Encounters:  12/15/13 211 lb (95.709 kg)  03/02/13 202 lb (91.627 kg)  11/03/12 222 lb (100.699 kg)    HLD- not at goal for a diabetic. Lab Results  Component Value Date   CHOL 223* 12/14/2013   HDL 31.60* 12/14/2013   LDLDIRECT 144.4 12/14/2013   TRIG 238.0* 12/14/2013   CHOLHDL 7 12/14/2013    HTN- has been stable on Benicar HCT 20-12.5 mg daily. Denies any HA, blurred vision, LE edema, HA or SOB.  Lab Results  Component Value Date   CREATININE 0.7 12/14/2013   Depression- stable on Effexor (brand only)- 150 mg daily.  She wants to try generic again since trade effexor is so expensive. Denies any symptoms of anxiety or depression.    Patient Active Problem List   Diagnosis Date Noted  . Well woman exam with routine gynecological exam 12/15/2013  . Hypertriglyceridemia 10/23/2012  . Colon polyps 07/02/2011  . Diabetes   . Depression   . Hypertension    Past Medical History  Diagnosis Date  . Diabetes mellitus   . Depression   . Hypertension    Past Surgical History  Procedure Laterality Date  . Ankle replacement     History  Substance Use Topics  . Smoking status: Never Smoker    . Smokeless tobacco: Not on file  . Alcohol Use: Not on file   No family history on file. Allergies  Allergen Reactions  . Penicillins   . Tricor [Fenofibrate]     "felt bad and heavy."   Current Outpatient Prescriptions on File Prior to Visit  Medication Sig Dispense Refill  . COCONUT OIL PO Take by mouth.      . EFFEXOR XR 150 MG 24 hr capsule take 1 capsule by mouth once daily  30 capsule  6  . Flaxseed, Linseed, (FLAX SEED OIL PO) Take one by mouth daily      . L-THEANINE PO Take two by mouth daily      . Multiple Vitamin (MULTIVITAMIN) tablet Take 1 tablet by mouth daily.      . Nutritional Supplements (JUICE PLUS FIBRE PO) Take 2 by mouth daily      . olmesartan-hydrochlorothiazide (BENICAR HCT) 20-12.5 MG per tablet Take 1 tablet by mouth daily.      . Plant Sterols and Stanols (CHOLESTOFF) 450 MG TABS Take 2 by mouth three times daily with meals       No current facility-administered medications on file prior to visit.   The PMH, PSH, Social History, Family History, Medications, and allergies have been reviewed in [facility], and have been updated if  relevant.   Review of Systems See HPI Patient reports no  vision/ hearing changes,anorexia, weight change, fever ,adenopathy, persistant / recurrent hoarseness, swallowing issues, chest pain, edema,persistant / recurrent cough, hemoptysis, dyspnea(rest, exertional, paroxysmal nocturnal), gastrointestinal  bleeding (melena, rectal bleeding), abdominal pain, excessive heart burn, GU symptoms(dysuria, hematuria, pyuria, voiding/incontinence  Issues) syncope, focal weakness, severe memory loss, concerning skin lesions, depression, anxiety, abnormal bruising/bleeding, major joint swelling, breast masses or abnormal vaginal bleeding.       Objective:   Physical Exam Ht 5\' 4"  (1.626 m)  Wt 211 lb (95.709 kg)  BMI 36.20 kg/m2  LMP 03/06/2011 Ht 5\' 4"  (1.626 m)  Wt 211 lb (95.709 kg)  BMI 36.20 kg/m2  LMP 03/06/2011 Wt Readings from  Last 3 Encounters:  12/15/13 211 lb (95.709 kg)  03/02/13 202 lb (91.627 kg)  11/03/12 222 lb (100.699 kg)     General:  Well-developed,well-nourished,in no acute distress; alert,appropriate and cooperative throughout examination Head:  normocephalic and atraumatic.   Eyes:  vision grossly intact, pupils equal, pupils round, and pupils reactive to light.   Ears:  R ear normal and L ear normal.   Nose:  no external deformity.   Mouth:  good dentition.   Neck:  No deformities, masses, or tenderness noted. Breasts:  No mass, nodules, thickening, tenderness, bulging, retraction, inflamation, nipple discharge or skin changes noted.   Lungs:  Normal respiratory effort, chest expands symmetrically. Lungs are clear to auscultation, no crackles or wheezes. Heart:  Normal rate and regular rhythm. S1 and S2 normal without gallop, murmur, click, rub or other extra sounds. Abdomen:  Bowel sounds positive,abdomen soft and non-tender without masses, organomegaly or hernias noted. Rectal:  no external abnormalities.   Genitalia:  Pelvic Exam:        External: normal female genitalia without lesions or masses        Vagina: normal without lesions or masses        Cervix: normal without lesions or masses        Adnexa: normal bimanual exam without masses or fullness        Uterus: normal by palpation        Pap smear: performed Msk:  No deformity or scoliosis noted of thoracic or lumbar spine.   Extremities:  No clubbing, cyanosis, edema, or deformity noted with normal full range of motion of all joints.   Neurologic:  alert & oriented X3 and gait normal.   Skin:  Intact without suspicious lesions or rashes Cervical Nodes:  No lymphadenopathy noted Axillary Nodes:  No palpable lymphadenopathy Psych:  Cognition and judgment appear intact. Alert and cooperative with normal attention span and concentration. No apparent delusions, illusions, hallucinations      Assessment & Plan:

## 2013-12-15 NOTE — Addendum Note (Signed)
Addended by: Modena Nunnery on: 12/15/2013 02:03 PM   Modules accepted: Orders

## 2013-12-15 NOTE — Assessment & Plan Note (Signed)
Deteriorated. Discussed tx options- she would like to give metformin another try and continue working on diet and exercise. eRx sent for Metformin 500 mg twice daily. Follow up in 3 months.

## 2013-12-15 NOTE — Assessment & Plan Note (Signed)
Deteriorated and not at goal for a diabetic. She is not wanting to start a statin at this time. Aware of cardiac risks.

## 2013-12-15 NOTE — Assessment & Plan Note (Signed)
Colonoscopy UTD. 

## 2013-12-15 NOTE — Patient Instructions (Signed)
Good to see you. We are starting Metformin 500 mg twice daily with meals.  Please return in 3 months for labs.  Continue to work on [employer].  Set up your mammogram.

## 2013-12-15 NOTE — Assessment & Plan Note (Signed)
Reviewed preventive care protocols, scheduled due services, and updated immunizations Discussed nutrition, exercise, diet, and healthy lifestyle.  Pap smear today. Influenza and pneumonia vaccines given today.

## 2013-12-15 NOTE — Progress Notes (Signed)
Pre visit review using our clinic review tool, if applicable. No additional management support is needed unless otherwise documented below in the visit note. 

## 2013-12-15 NOTE — Assessment & Plan Note (Signed)
eRx sent for generic effexor. She will update me as previously was not as effective for her as trade rx.

## 2013-12-16 ENCOUNTER — Telehealth: Payer: Self-pay | Admitting: Family Medicine

## 2013-12-16 LAB — CYTOLOGY - PAP

## 2013-12-16 NOTE — Telephone Encounter (Signed)
emmi emailed °

## 2013-12-17 ENCOUNTER — Encounter: Payer: Self-pay | Admitting: *Deleted

## 2014-01-18 ENCOUNTER — Other Ambulatory Visit: Payer: Self-pay | Admitting: Family Medicine

## 2014-01-19 ENCOUNTER — Other Ambulatory Visit: Payer: Self-pay | Admitting: Family Medicine

## 2014-03-17 ENCOUNTER — Other Ambulatory Visit: Payer: BC Managed Care – PPO

## 2014-03-19 ENCOUNTER — Other Ambulatory Visit: Payer: Self-pay | Admitting: Family Medicine

## 2014-04-19 ENCOUNTER — Other Ambulatory Visit: Payer: Self-pay | Admitting: Family Medicine

## 2014-06-18 ENCOUNTER — Other Ambulatory Visit: Payer: Self-pay | Admitting: Family Medicine

## 2014-07-09 ENCOUNTER — Ambulatory Visit
Admission: RE | Admit: 2014-07-09 | Discharge: 2014-07-09 | Disposition: A | Discharge status: Home / Self Care | Payer: No Typology Code available for payment source | Source: Ambulatory Visit

## 2014-07-09 DIAGNOSIS — Z1231 Encounter for screening mammogram for malignant neoplasm of breast: Secondary | ICD-10-CM

## 2014-08-12 ENCOUNTER — Telehealth: Payer: Self-pay

## 2014-08-12 NOTE — Telephone Encounter (Signed)
Diabetic Bundle. Left voicemail advising pt her A1C blood test is due. Pt advised to contact PCP's office to schedule.  

## 2014-08-16 ENCOUNTER — Other Ambulatory Visit: Payer: Self-pay

## 2014-09-23 NOTE — Discharge Instructions (Signed)

## 2014-09-24 ENCOUNTER — Encounter
Admission: RE | Disposition: A | Discharge status: Home / Self Care | Payer: Self-pay | Source: Ambulatory Visit | Attending: Unknown Physician Specialty

## 2014-09-24 ENCOUNTER — Ambulatory Visit: Payer: No Typology Code available for payment source | Admitting: Anesthesiology

## 2014-09-24 ENCOUNTER — Encounter: Payer: Self-pay | Admitting: Anesthesiology

## 2014-09-24 ENCOUNTER — Ambulatory Visit
Admission: RE | Admit: 2014-09-24 | Discharge: 2014-09-24 | Disposition: A | Discharge status: Home / Self Care | Payer: No Typology Code available for payment source | Source: Ambulatory Visit | Attending: Unknown Physician Specialty | Admitting: Unknown Physician Specialty

## 2014-09-24 DIAGNOSIS — E119 Type 2 diabetes mellitus without complications: Secondary | ICD-10-CM | POA: Insufficient documentation

## 2014-09-24 DIAGNOSIS — I10 Essential (primary) hypertension: Secondary | ICD-10-CM | POA: Diagnosis not present

## 2014-09-24 DIAGNOSIS — T161XXA Foreign body in right ear, initial encounter: Secondary | ICD-10-CM | POA: Diagnosis present

## 2014-09-24 DIAGNOSIS — Z83511 Family history of glaucoma: Secondary | ICD-10-CM | POA: Diagnosis not present

## 2014-09-24 DIAGNOSIS — Z8489 Family history of other specified conditions: Secondary | ICD-10-CM | POA: Diagnosis not present

## 2014-09-24 DIAGNOSIS — X58XXXA Exposure to other specified factors, initial encounter: Secondary | ICD-10-CM | POA: Diagnosis not present

## 2014-09-24 DIAGNOSIS — Z8249 Family history of ischemic heart disease and other diseases of the circulatory system: Secondary | ICD-10-CM | POA: Insufficient documentation

## 2014-09-24 DIAGNOSIS — E785 Hyperlipidemia, unspecified: Secondary | ICD-10-CM | POA: Insufficient documentation

## 2014-09-24 HISTORY — DX: Adverse effect of unspecified anesthetic, initial encounter: T41.45XA

## 2014-09-24 HISTORY — DX: Unspecified hearing loss, unspecified ear: H91.90

## 2014-09-24 HISTORY — PX: FOREIGN BODY REMOVAL EAR: SHX5321

## 2014-09-24 HISTORY — DX: Hyperlipidemia, unspecified: E78.5

## 2014-09-24 HISTORY — DX: Other complications of anesthesia, initial encounter: T88.59XA

## 2014-09-24 LAB — GLUCOSE, CAPILLARY
GLUCOSE-CAPILLARY: 211 mg/dL — AB (ref 65–99)
Glucose-Capillary: 224 mg/dL — ABNORMAL HIGH (ref 65–99)

## 2014-09-24 SURGERY — REMOVAL, FOREIGN BODY, EAR
Anesthesia: General | Laterality: Right

## 2014-09-24 MED ORDER — LACTATED RINGERS IV SOLN
INTRAVENOUS | Status: DC
  Administered 2014-09-24: 08:00:00 via INTRAVENOUS

## 2014-09-24 MED ORDER — PROPOFOL 10 MG/ML IV BOLUS
INTRAVENOUS | Status: DC | PRN
  Administered 2014-09-24: 100 mg via INTRAVENOUS

## 2014-09-24 MED ORDER — MIDAZOLAM HCL 5 MG/5ML IJ SOLN
INTRAMUSCULAR | Status: DC | PRN
  Administered 2014-09-24: 2 mg via INTRAVENOUS

## 2014-09-24 MED ORDER — ONDANSETRON HCL 4 MG/2ML IJ SOLN
INTRAMUSCULAR | Status: DC | PRN
  Administered 2014-09-24: 4 mg via INTRAVENOUS

## 2014-09-24 MED ORDER — OXYCODONE HCL 5 MG PO TABS: 5.0000 mg | ORAL_TABLET | Freq: Once | ORAL | Status: DC | PRN

## 2014-09-24 MED ORDER — GLYCOPYRROLATE 0.2 MG/ML IJ SOLN
INTRAMUSCULAR | Status: DC | PRN
  Administered 2014-09-24: 0.1 mg via INTRAVENOUS

## 2014-09-24 MED ORDER — OXYCODONE HCL 5 MG/5ML PO SOLN: 5.0000 mg | Freq: Once | ORAL | Status: DC | PRN

## 2014-09-24 SURGICAL SUPPLY — 9 items
BALL CTTN LRG ABS STRL LF (GAUZE/BANDAGES/DRESSINGS)
BLADE MYR LANCE NRW W/HDL (BLADE) IMPLANT
CANISTER SUCT 1200ML W/VALVE (MISCELLANEOUS) ×3 IMPLANT
COTTONBALL LRG STERILE PKG (GAUZE/BANDAGES/DRESSINGS) IMPLANT
GLOVE BIO SURGEON STRL SZ7.5 (GLOVE) ×3 IMPLANT
STRAP BODY AND KNEE 60X3 (MISCELLANEOUS) ×3 IMPLANT
TOWEL OR 17X26 4PK STRL BLUE (TOWEL DISPOSABLE) ×3 IMPLANT
TUBING CONN 6MMX3.1M (TUBING) ×2
TUBING SUCTION CONN 0.25 STRL (TUBING) ×1 IMPLANT

## 2014-09-24 NOTE — Anesthesia Preprocedure Evaluation (Addendum)
Anesthesia Evaluation  Patient identified by MRN, date of birth, ID band  Reviewed: NPO status   History of Anesthesia Complications (+) history of anesthetic complications (tachycardia x1)  Airway Mallampati: II  TM Distance: >3 FB Neck ROM: full    Dental no notable dental hx.    Pulmonary neg pulmonary ROS,    Pulmonary exam normal       Cardiovascular Exercise Tolerance: Good hypertension, Normal cardiovascular exam    Neuro/Psych PSYCHIATRIC DISORDERS (depression) negative neurological ROS  negative psych ROS   GI/Hepatic negative GI ROS, Neg liver ROS,   Endo/Other  diabetesbmi=[id number]  Renal/GU negative Renal ROS  negative genitourinary   Musculoskeletal   Abdominal   Peds  Hematology negative hematology ROS (+)   Anesthesia Other Findings   Reproductive/Obstetrics negative OB ROS                            Anesthesia Physical Anesthesia Plan  ASA: II  Anesthesia Plan: General   Post-op Pain Management:    Induction:   Airway Management Planned:   Additional Equipment:   Intra-op Plan:   Post-operative Plan:   Informed Consent: I have reviewed the patients History and Physical, chart, labs and discussed the procedure including the risks, benefits and alternatives for the proposed anesthesia with the patient or authorized representative who has indicated his/her understanding and acceptance.     Plan Discussed with: CRNA  Anesthesia Plan Comments: (MASK with IV)       Anesthesia Quick Evaluation

## 2014-09-24 NOTE — H&P (Signed)
  H+P  Reviewed and will be scanned in later. No changes noted. 

## 2014-09-24 NOTE — Op Note (Signed)
09/24/2014  9:00 AM    Sheena Ruiz  [id number]   Pre-Op Dx: FB H74.8X1 CHL H90.0  Post-op Dx: SAME  Proc: EUA ears removal of foreign body Right ear   Surg:  Shelma Eiben T  Anes:  GOT  EBL:  0  Comp:  0  Findings:  Foreign body right ear canal  Procedure: Sheena Ruiz was taken to the OR and placed in supine position.  After general masked anesthesia the microscope was brought into the field.  The right ear was examined.  A blue putty plug was identified wedged down onto the TM.  This was removed using the cerumen spoon.  Once removed the canal and TM were normal.  The left ear was examined.  No foreign body seen.  The patient was taken to PACU in stable condition.    Dispo:   Good  Plan:  DC to home  [name] T  09/24/2014 9:00 AM

## 2014-09-24 NOTE — Anesthesia Procedure Notes (Signed)
Performed by: Paulette Lynch Pre-anesthesia Checklist: Patient identified, Emergency Drugs available, Suction available, Timeout performed and Patient being monitored Patient Re-evaluated:Patient Re-evaluated prior to inductionOxygen Delivery Method: Circle system utilized Preoxygenation: Pre-oxygenation with 100% oxygen Intubation Type: Inhalational induction Ventilation: Mask ventilation without difficulty and Mask ventilation throughout procedure Dental Injury: Teeth and Oropharynx as per pre-operative assessment        

## 2014-09-24 NOTE — Anesthesia Postprocedure Evaluation (Signed)
  Anesthesia Post-op Note  Patient: Sheena Ruiz  Procedure(s) Performed: Procedure(s) with comments: REMOVAL FOREIGN BODY EAR EUA (Right) - DIABETIC-ORAL MEDS, EUA bilaterally  Anesthesia type:General  Patient location: PACU  Post pain: Pain level controlled  Post assessment: Post-op Vital signs reviewed, Patient's Cardiovascular Status Stable, Respiratory Function Stable, Patent Airway and No signs of Nausea or vomiting  Post vital signs: Reviewed and stable  Last Vitals:  Filed Vitals:   09/24/14 0908  BP: 117/65  Pulse: 92  Temp: 36.4 C  Resp: 19    Level of consciousness: awake, alert  and patient cooperative  Complications: No apparent anesthesia complications

## 2014-09-24 NOTE — Transfer of Care (Signed)
Immediate Anesthesia Transfer of Care Note  Patient: Sheena Ruiz  Procedure(s) Performed: Procedure(s) with comments: REMOVAL FOREIGN BODY EAR EUA (Right) - DIABETIC-ORAL MEDS, EUA bilaterally  Patient Location: PACU  Anesthesia Type: General  Level of Consciousness: awake, alert  and patient cooperative  Airway and Oxygen Therapy: Patient Spontanous Breathing and Patient connected to supplemental oxygen  Post-op Assessment: Post-op Vital signs reviewed, Patient's Cardiovascular Status Stable, Respiratory Function Stable, Patent Airway and No signs of Nausea or vomiting  Post-op Vital Signs: Reviewed and stable  Complications: No apparent anesthesia complications

## 2014-09-27 ENCOUNTER — Encounter: Payer: Self-pay | Admitting: Unknown Physician Specialty

## 2015-04-25 DIAGNOSIS — Z79899 Other long term (current) drug therapy: Secondary | ICD-10-CM | POA: Diagnosis not present

## 2015-04-25 DIAGNOSIS — I10 Essential (primary) hypertension: Secondary | ICD-10-CM | POA: Diagnosis not present

## 2015-04-25 DIAGNOSIS — E119 Type 2 diabetes mellitus without complications: Secondary | ICD-10-CM | POA: Diagnosis not present

## 2015-04-25 DIAGNOSIS — E669 Obesity, unspecified: Secondary | ICD-10-CM | POA: Diagnosis not present

## 2015-04-25 DIAGNOSIS — Z0000 Encounter for general adult medical examination without abnormal findings: Secondary | ICD-10-CM | POA: Diagnosis not present

## 2015-04-25 DIAGNOSIS — Z23 Encounter for immunization: Secondary | ICD-10-CM | POA: Diagnosis not present

## 2015-04-26 DIAGNOSIS — Z23 Encounter for immunization: Secondary | ICD-10-CM | POA: Diagnosis not present

## 2015-04-26 DIAGNOSIS — I10 Essential (primary) hypertension: Secondary | ICD-10-CM | POA: Diagnosis not present

## 2015-04-26 DIAGNOSIS — E669 Obesity, unspecified: Secondary | ICD-10-CM | POA: Diagnosis not present

## 2015-04-26 DIAGNOSIS — Z0000 Encounter for general adult medical examination without abnormal findings: Secondary | ICD-10-CM | POA: Diagnosis not present

## 2015-04-26 DIAGNOSIS — Z79899 Other long term (current) drug therapy: Secondary | ICD-10-CM | POA: Diagnosis not present

## 2015-04-26 DIAGNOSIS — E119 Type 2 diabetes mellitus without complications: Secondary | ICD-10-CM | POA: Diagnosis not present

## 2015-07-19 DIAGNOSIS — H5213 Myopia, bilateral: Secondary | ICD-10-CM | POA: Diagnosis not present

## 2016-07-23 DIAGNOSIS — M7122 Synovial cyst of popliteal space [Baker], left knee: Secondary | ICD-10-CM | POA: Diagnosis not present

## 2016-07-23 DIAGNOSIS — M25562 Pain in left knee: Secondary | ICD-10-CM | POA: Diagnosis not present

## 2016-07-23 DIAGNOSIS — M1712 Unilateral primary osteoarthritis, left knee: Secondary | ICD-10-CM | POA: Diagnosis not present

## 2016-07-23 DIAGNOSIS — M25462 Effusion, left knee: Secondary | ICD-10-CM | POA: Diagnosis not present

## 2016-07-30 DIAGNOSIS — M25462 Effusion, left knee: Secondary | ICD-10-CM | POA: Diagnosis not present

## 2016-07-30 DIAGNOSIS — M175 Other unilateral secondary osteoarthritis of knee: Secondary | ICD-10-CM | POA: Diagnosis not present

## 2016-07-30 DIAGNOSIS — M6281 Muscle weakness (generalized): Secondary | ICD-10-CM | POA: Diagnosis not present

## 2016-08-06 DIAGNOSIS — M175 Other unilateral secondary osteoarthritis of knee: Secondary | ICD-10-CM | POA: Diagnosis not present

## 2016-08-06 DIAGNOSIS — M6281 Muscle weakness (generalized): Secondary | ICD-10-CM | POA: Diagnosis not present

## 2016-08-06 DIAGNOSIS — M25462 Effusion, left knee: Secondary | ICD-10-CM | POA: Diagnosis not present

## 2016-08-13 DIAGNOSIS — M175 Other unilateral secondary osteoarthritis of knee: Secondary | ICD-10-CM | POA: Diagnosis not present

## 2016-08-13 DIAGNOSIS — M6281 Muscle weakness (generalized): Secondary | ICD-10-CM | POA: Diagnosis not present

## 2016-09-14 DIAGNOSIS — M25562 Pain in left knee: Secondary | ICD-10-CM | POA: Diagnosis not present

## 2016-09-14 DIAGNOSIS — M1712 Unilateral primary osteoarthritis, left knee: Secondary | ICD-10-CM | POA: Diagnosis not present

## 2016-10-15 DIAGNOSIS — M25462 Effusion, left knee: Secondary | ICD-10-CM | POA: Diagnosis not present

## 2016-10-15 DIAGNOSIS — S83522A Sprain of posterior cruciate ligament of left knee, initial encounter: Secondary | ICD-10-CM | POA: Diagnosis not present

## 2016-10-15 DIAGNOSIS — S83512A Sprain of anterior cruciate ligament of left knee, initial encounter: Secondary | ICD-10-CM | POA: Diagnosis not present

## 2016-10-15 DIAGNOSIS — M2242 Chondromalacia patellae, left knee: Secondary | ICD-10-CM | POA: Diagnosis not present

## 2016-10-15 DIAGNOSIS — S83242A Other tear of medial meniscus, current injury, left knee, initial encounter: Secondary | ICD-10-CM | POA: Diagnosis not present

## 2016-10-24 DIAGNOSIS — M7122 Synovial cyst of popliteal space [Baker], left knee: Secondary | ICD-10-CM | POA: Diagnosis not present

## 2016-10-24 DIAGNOSIS — M23322 Other meniscus derangements, posterior horn of medial meniscus, left knee: Secondary | ICD-10-CM | POA: Diagnosis not present

## 2017-01-26 DIAGNOSIS — R8290 Unspecified abnormal findings in urine: Secondary | ICD-10-CM | POA: Diagnosis not present

## 2017-01-26 DIAGNOSIS — R42 Dizziness and giddiness: Secondary | ICD-10-CM | POA: Diagnosis not present

## 2017-01-26 DIAGNOSIS — R05 Cough: Secondary | ICD-10-CM | POA: Diagnosis not present

## 2017-01-26 DIAGNOSIS — R000 Tachycardia, unspecified: Secondary | ICD-10-CM | POA: Diagnosis not present

## 2017-03-27 ENCOUNTER — Telehealth: Payer: Self-pay | Admitting: *Deleted

## 2017-03-27 ENCOUNTER — Ambulatory Visit (INDEPENDENT_AMBULATORY_CARE_PROVIDER_SITE_OTHER): Payer: 59 | Admitting: Family Medicine

## 2017-03-27 ENCOUNTER — Encounter: Payer: Self-pay | Admitting: Family Medicine

## 2017-03-27 ENCOUNTER — Other Ambulatory Visit: Payer: Self-pay

## 2017-03-27 VITALS — BP 128/68 | HR 82 | Temp 98.4°F | Resp 16 | Ht 64.0 in | Wt 197.0 lb

## 2017-03-27 DIAGNOSIS — M7631 Iliotibial band syndrome, right leg: Secondary | ICD-10-CM

## 2017-03-27 DIAGNOSIS — S83282D Other tear of lateral meniscus, current injury, left knee, subsequent encounter: Secondary | ICD-10-CM | POA: Diagnosis not present

## 2017-03-27 DIAGNOSIS — Z1239 Encounter for other screening for malignant neoplasm of breast: Secondary | ICD-10-CM

## 2017-03-27 DIAGNOSIS — E782 Mixed hyperlipidemia: Secondary | ICD-10-CM

## 2017-03-27 DIAGNOSIS — M25569 Pain in unspecified knee: Secondary | ICD-10-CM | POA: Insufficient documentation

## 2017-03-27 DIAGNOSIS — G8929 Other chronic pain: Secondary | ICD-10-CM

## 2017-03-27 DIAGNOSIS — Z124 Encounter for screening for malignant neoplasm of cervix: Secondary | ICD-10-CM

## 2017-03-27 DIAGNOSIS — M25561 Pain in right knee: Secondary | ICD-10-CM

## 2017-03-27 DIAGNOSIS — F3340 Major depressive disorder, recurrent, in remission, unspecified: Secondary | ICD-10-CM | POA: Diagnosis not present

## 2017-03-27 DIAGNOSIS — I10 Essential (primary) hypertension: Secondary | ICD-10-CM | POA: Diagnosis not present

## 2017-03-27 DIAGNOSIS — M25562 Pain in left knee: Secondary | ICD-10-CM

## 2017-03-27 DIAGNOSIS — Z0000 Encounter for general adult medical examination without abnormal findings: Secondary | ICD-10-CM | POA: Diagnosis not present

## 2017-03-27 DIAGNOSIS — Z1231 Encounter for screening mammogram for malignant neoplasm of breast: Secondary | ICD-10-CM

## 2017-03-27 DIAGNOSIS — Z23 Encounter for immunization: Secondary | ICD-10-CM

## 2017-03-27 DIAGNOSIS — E119 Type 2 diabetes mellitus without complications: Secondary | ICD-10-CM | POA: Diagnosis not present

## 2017-03-27 DIAGNOSIS — Z780 Asymptomatic menopausal state: Secondary | ICD-10-CM

## 2017-03-27 DIAGNOSIS — R69 Illness, unspecified: Secondary | ICD-10-CM | POA: Diagnosis not present

## 2017-03-27 MED ORDER — BLOOD GLUCOSE SYSTEM PAK KIT: PACK | 1 refills | Status: DC

## 2017-03-27 MED ORDER — BLOOD GLUCOSE TEST VI STRP: ORAL_STRIP | 1 refills | Status: DC

## 2017-03-27 MED ORDER — LANCET DEVICES MISC: 1 refills | Status: AC

## 2017-03-27 MED ORDER — LANCETS MISC: 1 refills | Status: AC

## 2017-03-27 MED ORDER — CIPROFLOXACIN HCL 250 MG PO TABS: 250.0000 mg | ORAL_TABLET | Freq: Every day | ORAL | 0 refills | Status: DC | PRN

## 2017-03-27 NOTE — Patient Instructions (Signed)
Call and schedule your Colonoscopy  Medications refilled We will call with lab results  I recommend eye visit once a year I recommend dental visit every 6 months Goal is to  Exercise 30 minutes 5 days a week Referral to [place] in [place]  F/U 4 months

## 2017-03-27 NOTE — Telephone Encounter (Signed)
Call placed to pharmacy.   Patient is taking Cipro 250mg  PO QD PRN after intercourse.   Prescription refilled.

## 2017-03-27 NOTE — Assessment & Plan Note (Signed)
BP looks good today, will continue to monitor

## 2017-03-27 NOTE — Assessment & Plan Note (Signed)
Chronic joint pain, will try Physical therapy, she had good results with water therapy in the past F/U orthopedics when she is ready to undergo surgery

## 2017-03-27 NOTE — Assessment & Plan Note (Signed)
Continue current medication, she has good support group

## 2017-03-27 NOTE — Progress Notes (Signed)
Subjective:    Patient ID: Sheena Ruiz, female    DOB: 09/15/1960, 57 y.o.   MRN: [id number]  Patient presents for New Patient CPE with PAP (is fasting)  Patient here to establish care and for physical exam. Previous PCP - [place] She has been seen by [place] for OA of her Genene Churn has known meniscal tear and bakers cyts, which she needs surgery but due to caring for her parents had to put off. Also has history of right ankle replacement and gets IT band syndrome. No chronic pain medications  She has chronic leg pain, has RLS worse at night, but gets tingling and numbness episodes as well. Not sure if related to her diabetes  Request referral to Physical therapy   [facility]  Overdue for Mammogram/ PAP Smear  Immunizations- Due for TDAP, has had PNA and shingles   DM- unknown last A1C, does not check her CBG  HTN- taking Benicar HCTZ for many years, diagnosed in 48's MDD- diagnosed in her college years, had been in therapy in the past, has good support with church, also in grad school for [employer], taking effexor  150mg  once a day for years feels well on this medication   Hyperlipidemia- does not tolerate statins or [place], tries to manage with diet   UTI after intercourse- takes Cipro after intercourse  Wears glasses    Has 2 children, 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    Review Of Systems:  GEN- denies fatigue, fever, weight loss,weakness, recent illness HEENT- denies eye drainage, change in vision, nasal discharge, CVS- denies chest pain, palpitations RESP- denies SOB, cough, wheeze ABD- denies N/V, change in stools, abd pain GU- denies dysuria, hematuria, dribbling, incontinence MSK- +joint pain, muscle aches, injury Neuro- denies headache, dizziness, syncope, seizure activity       Objective:    BP 128/68   Pulse 82   Temp 98.4 F (36.9 C) (Oral)   Resp 16    Ht 5\' 4"  (1.626 m)   Wt 197 lb (89.4 kg)   LMP 03/06/2011   SpO2 98%   BMI 33.81 kg/m  GEN- NAD, alert and oriented x3 HEENT- PERRL, EOMI, non injected sclera, pink conjunctiva, MMM, oropharynx clear Neck- Supple, no thyromegaly Breast- normal symmetry, no nipple inversion,no nipple drainage, no nodules or lumps felt Nodes- no axillary nodesCVS- RRR, no murmur RESP-CTAB ABD-NABS,soft,NT,ND GU- normal external genitalia, vaginal mucosa pink and moist, cervix visualized no growth, no blood form os, no  discharge, no CMT, no ovarian masses, uterus normal size, rectum normal tone FOBT neg, hemorroidal tag Psych- normal affect and mood  EXT- No edema Pulses- Radial, DP- 2+        Assessment & Plan:      Problem List Items Addressed This Visit      Unprioritized   HLD (hyperlipidemia)   Relevant Orders   Lipid panel   It band syndrome, right   Hypertension    BP looks good today, will continue to monitor      Diabetes (HCC)    Check A1C fasting lipids New meter sent to pharmacy  She is on ARB Declines statins      Relevant Orders   Hemoglobin A1c   Microalbumin / creatinine urine ratio   Depression    Continue current medication, she has good support group      Chronic knee pain    Chronic joint pain, will try  Physical therapy, she had good results with water therapy in the past F/U orthopedics when she is ready to undergo surgery      Acute lateral meniscal tear, left, subsequent encounter    Other Visit Diagnoses    Routine general medical examination at a health care facility    -  Primary   CPE done, fasting labs, TDAP given, schedule mammogram, she requested early bone density.    Relevant Orders   CBC with Differential/Platelet   Comprehensive metabolic panel   TSH   Tdap vaccine greater than or equal to 7yo IM (Completed)   Cervical cancer screening       Relevant Orders   Pap IG w/ reflex to HPV when ASC-U   Breast cancer screening        Relevant Orders   MM SCREENING BREAST TOMO BILATERAL   Post-menopausal       Relevant Orders   DG Bone Density   Need for diphtheria-tetanus-pertussis (Tdap) vaccine       Relevant Orders   Tdap vaccine greater than or equal to 7yo IM (Completed)      Note: This dictation was prepared with Dragon dictation along with smaller phrase technology. Any transcriptional errors that result from this process are unintentional.

## 2017-03-27 NOTE — Assessment & Plan Note (Signed)
Check A1C fasting lipids New meter sent to pharmacy  She is on ARB Declines statins

## 2017-03-28 LAB — CBC WITH DIFFERENTIAL/PLATELET
Basophils Absolute: 82 cells/uL (ref 0–200)
Basophils Relative: 0.7 %
EOS PCT: 0.9 %
Eosinophils Absolute: 105 cells/uL (ref 15–500)
HCT: 41.9 % (ref 35.0–45.0)
Hemoglobin: 14 g/dL (ref 11.7–15.5)
Lymphs Abs: 4060 cells/uL — ABNORMAL HIGH (ref 850–3900)
MCH: 27.9 pg (ref 27.0–33.0)
MCHC: 33.4 g/dL (ref 32.0–36.0)
MCV: 83.6 fL (ref 80.0–100.0)
MPV: 9.1 fL (ref 7.5–12.5)
Monocytes Relative: 4.1 %
NEUTROS PCT: 59.6 %
Neutro Abs: 6973 cells/uL (ref 1500–7800)
PLATELETS: 428 10*3/uL — AB (ref 140–400)
RBC: 5.01 10*6/uL (ref 3.80–5.10)
RDW: 12.7 % (ref 11.0–15.0)
TOTAL LYMPHOCYTE: 34.7 %
WBC mixed population: 480 cells/uL (ref 200–950)
WBC: 11.7 10*3/uL — ABNORMAL HIGH (ref 3.8–10.8)

## 2017-03-28 LAB — COMPREHENSIVE METABOLIC PANEL
AG Ratio: 1.6 (calc) (ref 1.0–2.5)
ALBUMIN MSPROF: 4.4 g/dL (ref 3.6–5.1)
ALKALINE PHOSPHATASE (APISO): 87 U/L (ref 33–130)
ALT: 12 U/L (ref 6–29)
AST: 10 U/L (ref 10–35)
BUN: 12 mg/dL (ref 7–25)
CO2: 28 mmol/L (ref 20–32)
CREATININE: 0.63 mg/dL (ref 0.50–1.05)
Calcium: 9.8 mg/dL (ref 8.6–10.4)
Chloride: 99 mmol/L (ref 98–110)
Globulin: 2.7 g/dL (calc) (ref 1.9–3.7)
Glucose, Bld: 248 mg/dL — ABNORMAL HIGH (ref 65–99)
POTASSIUM: 4.4 mmol/L (ref 3.5–5.3)
SODIUM: 138 mmol/L (ref 135–146)
TOTAL PROTEIN: 7.1 g/dL (ref 6.1–8.1)
Total Bilirubin: 0.5 mg/dL (ref 0.2–1.2)

## 2017-03-28 LAB — LIPID PANEL
CHOL/HDL RATIO: 6 (calc) — AB (ref <5.0)
CHOLESTEROL: 216 mg/dL — AB (ref <200)
HDL: 36 mg/dL — ABNORMAL LOW (ref >50)
Non-HDL Cholesterol (Calc): 180 mg/dL (calc) — ABNORMAL HIGH (ref <130)
TRIGLYCERIDES: 547 mg/dL — AB (ref <150)

## 2017-03-28 LAB — MICROALBUMIN / CREATININE URINE RATIO
CREATININE, URINE: 202 mg/dL (ref 20–275)
MICROALB UR: 4.3 mg/dL
MICROALB/CREAT RATIO: 21 ug/mg{creat} (ref <30)

## 2017-03-28 LAB — HEMOGLOBIN A1C
HEMOGLOBIN A1C: 10.6 %{Hb} — AB (ref <5.7)
Mean Plasma Glucose: 258 (calc)
eAG (mmol/L): 14.3 (calc)

## 2017-03-28 LAB — TSH: TSH: 0.98 mIU/L (ref 0.40–4.50)

## 2017-04-02 ENCOUNTER — Other Ambulatory Visit: Payer: Self-pay | Admitting: *Deleted

## 2017-04-02 LAB — PAP IG W/ RFLX HPV ASCU

## 2017-04-02 MED ORDER — METFORMIN HCL 1000 MG PO TABS: 1000.0000 mg | ORAL_TABLET | Freq: Two times a day (BID) | ORAL | 3 refills | Status: DC

## 2017-04-05 ENCOUNTER — Other Ambulatory Visit: Payer: Self-pay | Admitting: Family Medicine

## 2017-04-05 DIAGNOSIS — E2839 Other primary ovarian failure: Secondary | ICD-10-CM

## 2017-04-17 ENCOUNTER — Ambulatory Visit: Payer: Self-pay | Admitting: Family Medicine

## 2017-04-23 ENCOUNTER — Ambulatory Visit (INDEPENDENT_AMBULATORY_CARE_PROVIDER_SITE_OTHER): Payer: 59 | Admitting: Family Medicine

## 2017-04-23 ENCOUNTER — Other Ambulatory Visit: Payer: Self-pay

## 2017-04-23 ENCOUNTER — Encounter: Payer: Self-pay | Admitting: Family Medicine

## 2017-04-23 VITALS — BP 122/64 | HR 74 | Temp 98.3°F | Resp 14 | Ht 64.0 in | Wt 199.0 lb

## 2017-04-23 DIAGNOSIS — F5101 Primary insomnia: Secondary | ICD-10-CM

## 2017-04-23 DIAGNOSIS — R69 Illness, unspecified: Secondary | ICD-10-CM | POA: Diagnosis not present

## 2017-04-23 DIAGNOSIS — F3340 Major depressive disorder, recurrent, in remission, unspecified: Secondary | ICD-10-CM | POA: Diagnosis not present

## 2017-04-23 DIAGNOSIS — E119 Type 2 diabetes mellitus without complications: Secondary | ICD-10-CM | POA: Diagnosis not present

## 2017-04-23 DIAGNOSIS — E782 Mixed hyperlipidemia: Secondary | ICD-10-CM | POA: Diagnosis not present

## 2017-04-23 DIAGNOSIS — G4700 Insomnia, unspecified: Secondary | ICD-10-CM | POA: Insufficient documentation

## 2017-04-23 MED ORDER — ALPRAZOLAM 0.5 MG PO TABS: 0.5000 mg | ORAL_TABLET | Freq: Every evening | ORAL | 2 refills | Status: DC | PRN

## 2017-04-23 NOTE — Progress Notes (Signed)
   Subjective:    Patient ID: Sheena Ruiz, female    DOB: [birth year], 57 y.o.   MRN: [id number]  Patient presents for Follow-up (is fasting)  Pt here for intermin f/u on diabetes , her A1C was  10.6% at visit to establish care  Increased metformin to 1000mg  twice a day, she was previously on 500mg  BID Know how to use her meter is not check her blood sugars   Her TG were very high at 576 LDL unable to calculate   To have a lot of stress dealing with her family and caring for her parents.  She knows that she is not taking care of herself.  She has not been sleeping well the past few months.  She does feel benefit from her Effexor.  In the past she was on Xanax.  She was not on the other sleep meds but she is often taking over-the-counter melatonin and valerian root with minimal improvement.    Review Of Systems:  GEN- denies fatigue, fever, weight loss,weakness, recent illness HEENT- denies eye drainage, change in vision, nasal discharge, CVS- denies chest pain, palpitations RESP- denies SOB, cough, wheeze ABD- denies N/V, change in stools, abd pain GU- denies dysuria, hematuria, dribbling, incontinence MSK- denies joint pain, muscle aches, injury Neuro- denies headache, dizziness, syncope, seizure activity       Objective:    BP 122/64   Pulse 74   Temp 98.3 F (36.8 C) (Oral)   Resp 14   Ht 5\' 4"  (1.626 m)   Wt 199 lb (90.3 kg)   LMP 03/06/2011   SpO2 96%   BMI 34.16 kg/m  GEN- NAD, alert and oriented x3 Psych- tired appearing, not depressed, no SI, normal speech        Assessment & Plan:      Problem List Items Addressed This Visit      Unprioritized   Depression - Primary   Relevant Medications   ALPRAZolam (XANAX) 0.5 MG tablet   Insomnia    Significant stressors 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  This is been going on for quite some time.  She has neglected her own health.  We discussed some options for her mood and her sleep.  We will put  her on low-dose alprazolam at bedtime 0.5 mg or she can break in half.  She will continue the Effexor at the current dose.      HLD (hyperlipidemia)    Triglycerides significantly elevated in setting of her uncontrolled diabetes.  Recommend that she take fish oil or Krill oil at this time.  We will recheck her levels before starting other treatment once her blood sugars come down in 2 months. This is a chronic problem for her.      Diabetes ([place])    Diabetes is uncontrolled.  Increase her metformin recently we will see how her body adjust to this.  She was shown how to use her glucometer here in the office.         Note: This dictation was prepared with Dragon dictation along with smaller phrase technology. Any transcriptional errors that result from this process are unintentional.

## 2017-04-23 NOTE — Patient Instructions (Signed)
F/u 2 months 

## 2017-04-24 ENCOUNTER — Encounter: Payer: Self-pay | Admitting: Family Medicine

## 2017-04-24 NOTE — Assessment & Plan Note (Addendum)
Diabetes is uncontrolled.  Increase her metformin recently we will see how her body adjust to this.  She was shown how to use her glucometer here in the office.

## 2017-04-24 NOTE — Assessment & Plan Note (Signed)
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  This is been going on for quite some time.  She has neglected her own health.  We discussed some options for her mood and her sleep.  We will put her on low-dose alprazolam at bedtime 0.5 mg or she can break in half.  She will continue the Effexor at the current dose.

## 2017-04-24 NOTE — Assessment & Plan Note (Addendum)
Triglycerides significantly elevated in setting of her uncontrolled diabetes.  Recommend that she take fish oil or Krill oil at this time.  We will recheck her levels before starting other treatment once her blood sugars come down in 2 months. This is a chronic problem for her.

## 2017-04-26 ENCOUNTER — Other Ambulatory Visit: Payer: No Typology Code available for payment source

## 2017-04-26 ENCOUNTER — Ambulatory Visit: Payer: No Typology Code available for payment source

## 2017-05-16 ENCOUNTER — Inpatient Hospital Stay: Admission: RE | Admit: 2017-05-16 | Payer: No Typology Code available for payment source | Source: Ambulatory Visit

## 2017-05-16 ENCOUNTER — Ambulatory Visit: Payer: No Typology Code available for payment source

## 2017-06-20 ENCOUNTER — Ambulatory Visit: Payer: No Typology Code available for payment source

## 2017-06-20 ENCOUNTER — Inpatient Hospital Stay: Admission: RE | Admit: 2017-06-20 | Payer: No Typology Code available for payment source | Source: Ambulatory Visit

## 2017-06-24 ENCOUNTER — Ambulatory Visit: Payer: 59 | Admitting: Family Medicine

## 2017-07-01 ENCOUNTER — Ambulatory Visit: Payer: 59 | Admitting: Family Medicine

## 2017-07-01 ENCOUNTER — Encounter: Payer: Self-pay | Admitting: Family Medicine

## 2017-07-01 ENCOUNTER — Other Ambulatory Visit: Payer: Self-pay

## 2017-07-01 VITALS — BP 130/68 | HR 84 | Temp 97.9°F | Resp 14 | Ht 64.0 in | Wt 196.0 lb

## 2017-07-01 DIAGNOSIS — E669 Obesity, unspecified: Secondary | ICD-10-CM

## 2017-07-01 DIAGNOSIS — E119 Type 2 diabetes mellitus without complications: Secondary | ICD-10-CM | POA: Diagnosis not present

## 2017-07-01 DIAGNOSIS — I10 Essential (primary) hypertension: Secondary | ICD-10-CM | POA: Diagnosis not present

## 2017-07-01 DIAGNOSIS — R69 Illness, unspecified: Secondary | ICD-10-CM | POA: Diagnosis not present

## 2017-07-01 DIAGNOSIS — F3340 Major depressive disorder, recurrent, in remission, unspecified: Secondary | ICD-10-CM | POA: Diagnosis not present

## 2017-07-01 MED ORDER — PLANT STEROLS AND STANOLS 450 MG PO CAPS: 2.0000 | ORAL_CAPSULE | Freq: Three times a day (TID) | ORAL | Status: DC

## 2017-07-01 MED ORDER — VENLAFAXINE HCL ER 150 MG PO CP24: ORAL_CAPSULE | ORAL | 5 refills | Status: DC

## 2017-07-01 NOTE — Assessment & Plan Note (Addendum)
Blood pressure well controlled no changes Scheduled eye exam for this summer

## 2017-07-01 NOTE — Assessment & Plan Note (Signed)
Improved home readings, expect A1C to be much better Goal , 7% Her blood if her A1c is still elevated greater than 7.5% I will start Jardiance as she does have family history of heart disease and has hyperlipidemia herself.

## 2017-07-01 NOTE — Patient Instructions (Signed)
F/U 3 months - Morning appointment for fasting labs

## 2017-07-01 NOTE — Addendum Note (Signed)
Addended by: Vic Blackbird F on: 07/01/2017 05:19 PM   Modules accepted: Orders

## 2017-07-01 NOTE — Progress Notes (Signed)
   Subjective:    Patient ID: Sheena Ruiz, female    DOB: 07-26-1960, 57 y.o.   MRN: [id number]  Patient presents for Follow-up (is not fasting)  Pt here to f/u DM- A1C was  10.6% at visit in Feb   Metformin currently 1000mg  BID  CBG - lowest 122, average 140's, highest 170's  neuropathy has improved   family has improved the diet  Hyperlipidemia- in setting of DM, TG were 547,this is a chronic problems for her   Insomnia/Depression- currently on effexor and xanax      Review Of Systems:  GEN- denies fatigue, fever, weight loss,weakness, recent illness HEENT- denies eye drainage, change in vision, nasal discharge, CVS- denies chest pain, palpitations RESP- denies SOB, cough, wheeze ABD- denies N/V, change in stools, abd pain GU- denies dysuria, hematuria, dribbling, incontinence MSK- denies joint pain, muscle aches, injury Neuro- denies headache, dizziness, syncope, seizure activity       Objective:    BP 130/68   Pulse 84   Temp 97.9 F (36.6 C) (Oral)   Resp 14   Ht 5\' 4"  (1.626 m)   Wt 196 lb (88.9 kg)   LMP 03/06/2011   SpO2 98%   BMI 33.64 kg/m  GEN- NAD, alert and oriented x3 HEENT- PERRL, EOMI, non injected sclera, pink conjunctiva, MMM, oropharynx clear Neck- Supple, no thyromegaly CVS- RRR, no murmur RESP-CTAB ABD-NABS,soft,NT,ND EXT- No edema Pulses- Radial, DP- 2+        Assessment & Plan:      Problem List Items Addressed This Visit      Unprioritized   Obesity (BMI 30.0-34.9)   Hypertension    Blood pressure well controlled no changes Scheduled eye exam for this summer      Relevant Medications   aspirin EC 81 MG tablet   Diabetes ([place]) - Primary    Improved home readings, expect A1C to be much better Goal , 7% Her blood if her A1c is still elevated greater than 7.5% I will start Jardiance as she does have family history of heart disease and has hyperlipidemia herself.      Relevant Medications   aspirin EC 81 MG tablet    Other Relevant Orders   Basic metabolic panel   Hemoglobin A1c   HM DIABETES FOOT EXAM (Completed)   Depression    She has been on other antidepressants in the past she does well on brand-name only Effexor which helps control her depression.  I do not recommend going to the generic which she has tried before and had worsening depression symptoms.      Relevant Medications   venlafaxine XR (EFFEXOR-XR) 150 MG 24 hr capsule      Note: This dictation was prepared with Dragon dictation along with smaller phrase technology. Any transcriptional errors that result from this process are unintentional.

## 2017-07-01 NOTE — Assessment & Plan Note (Signed)
She has been on other antidepressants in the past she does well on brand-name only Effexor which helps control her depression.  I do not recommend going to the generic which she has tried before and had worsening depression symptoms.

## 2017-07-02 ENCOUNTER — Other Ambulatory Visit: Payer: Self-pay | Admitting: *Deleted

## 2017-07-02 LAB — BASIC METABOLIC PANEL
BUN: 15 mg/dL (ref 7–25)
CO2: 27 mmol/L (ref 20–32)
CREATININE: 0.7 mg/dL (ref 0.50–1.05)
Calcium: 10.2 mg/dL (ref 8.6–10.4)
Chloride: 101 mmol/L (ref 98–110)
GLUCOSE: 177 mg/dL — AB (ref 65–99)
POTASSIUM: 4 mmol/L (ref 3.5–5.3)
SODIUM: 140 mmol/L (ref 135–146)

## 2017-07-02 LAB — HEMOGLOBIN A1C
EAG (MMOL/L): 12.4 (calc)
Hgb A1c MFr Bld: 9.4 % of total Hgb — ABNORMAL HIGH (ref <5.7)
Mean Plasma Glucose: 223 (calc)

## 2017-07-02 MED ORDER — EMPAGLIFLOZIN 25 MG PO TABS: 25.0000 mg | ORAL_TABLET | Freq: Every day | ORAL | 0 refills | Status: DC

## 2017-07-03 ENCOUNTER — Telehealth: Payer: Self-pay | Admitting: *Deleted

## 2017-07-03 NOTE — Telephone Encounter (Signed)
Patient requires name brand Effexor due to increased SE from generic brand.   Prescription sent to pharmacy. Noted to be covered by insurance for >$300 co-pay.   Patient is covered under [employer] (1- [place]) Member ID [id number]. Will call for possible Tier Exception.

## 2017-07-04 NOTE — Telephone Encounter (Signed)
Call placed to [employer] to process a tier exception.   Exception approved 07/04/2017- 07/04/2020. Co-Pay $70.  Call placed to patient. [place].

## 2017-07-04 NOTE — Telephone Encounter (Signed)
Call placed to patient and patient made aware.  

## 2017-07-31 ENCOUNTER — Other Ambulatory Visit: Payer: Self-pay | Admitting: *Deleted

## 2017-07-31 NOTE — Patient Outreach (Signed)
[place] [facility]) Care Management  07/31/2017  Sheena Ruiz 1960-11-05 [id number]   Subjective: Telephone call to patient's home  / mobile number, no answer, left HIPAA compliant voicemail message, and requested call back.       Objective: Per KPN (Knowledge Performance Now, point of care tool) and chart review, patient had no recent hospitalizations or ED visits.  Update received from [employer]: 57 yo F Rising Risk, last HbA1C on file10.6 PMH- Diabetes, HTN, HLD, OA, per PCP claim Major Depression. Eligible for Able To Program  ( 8 week televideo [employer],  [facility] cost share -waive member responsibility).         Assessment:  Received [facility] Consult referral on 07/30/17.   [facility] Consult follow up pending patient contact.        Plan:RNCM will send unsuccessful outreach  letter, [facility] pamphlet, will call patient for 2nd telephone outreach attempt, [facility] Consult follow up, and proceed with case closure, within 10 business days if no return call.        Sheena Ruiz H. Annia Friendly, BSN, [place] Management [facility] Telephonic CM Phone: [phone] Fax: [phone]

## 2017-08-02 ENCOUNTER — Other Ambulatory Visit: Payer: Self-pay | Admitting: *Deleted

## 2017-08-02 ENCOUNTER — Ambulatory Visit: Payer: Self-pay | Admitting: *Deleted

## 2017-08-02 NOTE — Patient Outreach (Signed)
[facility] [facility]) Care Management  08/02/2017  Sheena Ruiz 1960/09/16 [id number]   Subjective: Telephone call to patient's home  / mobile number, no answer, left HIPAA compliant voicemail message, and requested call back.       Objective: Per KPN (Knowledge Performance Now, point of care tool) and chart review, patient had no recent hospitalizations or ED visits.  Update received from [employer]: 57 yo F Rising Risk, last HbA1C on file10.6 PMH- Diabetes, HTN, HLD, OA, per PCP claim Major Depression. Eligible for Able To Program  ( 8 week televideo [employer],  [facility] cost share -waive member responsibility).         Assessment:  Received [facility] Consult referral on 07/30/17.   [facility] Consult follow up pending patient contact.        Plan:RNCM has sent unsuccessful outreach  letter, [facility] pamphlet, will call patient for 3rd telephone outreach attempt, [facility] Consult follow up, and proceed with case closure, within 10 business days if no return call.       Carmeron Heady H. Annia Friendly, BSN, [place] Management [facility] Telephonic CM Phone: [phone] Fax: [phone]

## 2017-08-05 ENCOUNTER — Other Ambulatory Visit: Payer: Self-pay | Admitting: *Deleted

## 2017-08-05 NOTE — Patient Outreach (Signed)
[place] [facility]) Care Management  08/05/2017  ELIZABETHANN LACKEY November 02, 1960 [id number]   Subjective:Telephone call to patient's home / mobile number, no answer, left HIPAA compliant voicemail message, and requested call back.       Objective:Per KPN (Knowledge Performance Now, point of care tool) and chart review,patient had no recent hospitalizations or ED visits. Update received from [employer]: 57 yo F Rising Risk, last HbA1C on file10.6 PMH- Diabetes, HTN, HLD, OA, per PCP claim Major Depression. Eligible for Able To Program ( 8 week televideo [employer], [facility] cost share -waive member responsibility).      Assessment:Received [employer] [facility] Consult referral on 07/30/17.[facility] Consult follow up pending patient contact.       Plan:RNCM has sent unsuccessful outreach letter, [facility] pamphlet, and will proceed with case closure, within 10 business days if no return call.       Verner Mccrone H. Annia Friendly, BSN, [place] Management [facility] Telephonic CM Phone: [phone] Fax: [phone]

## 2017-08-13 ENCOUNTER — Other Ambulatory Visit: Payer: Self-pay | Admitting: *Deleted

## 2017-08-13 NOTE — Patient Outreach (Signed)
[facility] [facility] Care Management  08/13/2017  Sheena Ruiz Aug 05, 1960 [id number]   No response from patient outreach attempts will proceed with case closure.     Objective:Per KPN (Knowledge Performance Now, point of care tool) and chart review,patient had no recent hospitalizations or ED visits. Update received from [employer]: 57 yo F Rising Risk, last HbA1C on file10.6 PMH- Diabetes, HTN, HLD, OA, per PCP claim Major Depression. Eligible for Able To Program ( 8 week televideo [employer], [facility] cost share -waive member responsibility).      Assessment:Received [employer] [facility] Consult referral on 07/30/17.[facility] Consult not completed patient unable to contact and will proceed with case closure.       Plan:Case closure due to unable to reach.        Cristela Stalder H. Annia Friendly, BSN, [place] Management [facility] Telephonic CM Phone: [phone] Fax: [phone]

## 2017-08-14 ENCOUNTER — Ambulatory Visit: Payer: 59 | Admitting: Family Medicine

## 2017-08-14 ENCOUNTER — Encounter: Payer: Self-pay | Admitting: Family Medicine

## 2017-08-14 ENCOUNTER — Other Ambulatory Visit: Payer: Self-pay

## 2017-08-14 VITALS — BP 124/68 | HR 60 | Temp 98.6°F | Resp 14 | Ht 64.0 in | Wt 199.0 lb

## 2017-08-14 DIAGNOSIS — R400 Somnolence: Secondary | ICD-10-CM

## 2017-08-14 DIAGNOSIS — I10 Essential (primary) hypertension: Secondary | ICD-10-CM

## 2017-08-14 DIAGNOSIS — R5383 Other fatigue: Secondary | ICD-10-CM

## 2017-08-14 DIAGNOSIS — E119 Type 2 diabetes mellitus without complications: Secondary | ICD-10-CM

## 2017-08-14 DIAGNOSIS — R609 Edema, unspecified: Secondary | ICD-10-CM

## 2017-08-14 LAB — GLUCOSE 16585: Glucose: 153 mg/dL — ABNORMAL HIGH (ref 65–99)

## 2017-08-14 MED ORDER — BLOOD GLUCOSE TEST VI STRP: ORAL_STRIP | 1 refills | Status: DC

## 2017-08-14 MED ORDER — ONDANSETRON 4 MG PO TBDP: 4.0000 mg | ORAL_TABLET | Freq: Three times a day (TID) | ORAL | 0 refills | Status: DC | PRN

## 2017-08-14 MED ORDER — BLOOD GLUCOSE SYSTEM PAK KIT: PACK | 1 refills | Status: AC

## 2017-08-14 NOTE — Progress Notes (Signed)
Subjective:    Patient ID: Sheena Ruiz, female    DOB: 10/09/1960, 57 y.o.   MRN: [id number]  Patient presents for BLE Edema (feet swelling and has felt unwell)    Yesterday slept later than normal. Son had difficulty arousing her, states when she woke up still felt out of it. She rode to [place] with her son and stayed in the car. Finally ate and felt a little better  Then that evening had Nausea/dizziness/ had headache, photophobia  Has not been checking her blood sugar, she thinks she lost it, has not checked her CBG in a few weeks  Has been helping brother who is having issues  She lost samples of the Jardiance so has not taken   She has been taking aleve for bursitis in left shoulder,but also had tingling numbness in left arm  She did work out  Monday  Evening, also had bike ride, has noticed after exercising a lot she will feel very fatigued   Has noticed her feet swelling past few days as well   Had increased stress/ anxiety today- after receiving info about her father and brother today.  Took xanax Monday night, not sure if this contributed    Review Of Systems:  GEN- +fatigue, fever, weight loss,weakness, recent illness HEENT- denies eye drainage, change in vision, nasal discharge, CVS- denies chest pain, palpitations RESP- denies SOB, cough, wheeze ABD- +N/ denies V, change in stools, abd pain GU- denies dysuria, hematuria, dribbling, incontinence MSK- denies joint pain, muscle aches, injury Neuro- denies headache, +dizziness, syncope, seizure activity       Objective:    BP 124/68   Pulse 60   Temp 98.6 F (37 C) (Oral)   Resp 14   Ht 5\' 4"  (1.626 m)   Wt 199 lb (90.3 kg)   LMP 03/06/2011   SpO2 96%   BMI 34.16 kg/m  GEN- NAD, alert and oriented x3 HEENT- PERRL, EOMI, non injected sclera, pink conjunctiva, MM a little dry , oropharynx clear Neck- Supple, no thyromegaly, no JVD CVS- RRR, no murmur RESP-CTAB ABD-NABS,soft,NT,ND Psych- normal affect  and mood EXT- Right > Left pedal edema Pulses- Radial, DP- 2+    EKG- NSR, no ST changes  CBG 153     Assessment & Plan:    Elevate feet    Problem List Items Addressed This Visit      Unprioritized   Diabetes ([place]) - Primary    Random glucose normal in office As she has not checked during these episodes possible some glucose fluctations? Though does not explain the peripheral edema Check intermin A1C has been 6 weeks since her last one Diabetes has been uncontrolled for quite some time.        Relevant Orders   Glucose, fingerstick (stat)   Hemoglobin A1c (Completed)   Hypertension    Blood pressure is controlled She looks mildly dehydrated she has not had much to eat or drink today Check renal function, consider giving lasix for pedal edema, will see what labs look like first, encourage oral hydration  No change to BP med at this time      Relevant Orders   CBC with Differential/Platelet (Completed)   Comprehensive metabolic panel (Completed)   Brain natriuretic peptide (Completed)    Other Visit Diagnoses    Peripheral edema       check labs may be more coindiental with her episode, no sign of CHF otherwise. may be venous stasis   Relevant Orders  EKG 12-Lead (Completed)   Brain natriuretic peptide (Completed)   Other fatigue       given zofran for any nausea can also drink glucerna   Relevant Orders   Comprehensive metabolic panel (Completed)   Somnolence       Relevant Orders   Comprehensive metabolic panel (Completed)      Note: This dictation was prepared with Dragon dictation along with smaller phrase technology. Any transcriptional errors that result from this process are unintentional.

## 2017-08-14 NOTE — Patient Instructions (Addendum)
New meter sent to pharmacy  I will call with lab results  Elevate feet Hold jardiance Drink fluids /eat something  EKG is normal Glucerna  F/U PENDING RESULTS

## 2017-08-15 ENCOUNTER — Encounter: Payer: Self-pay | Admitting: Family Medicine

## 2017-08-15 LAB — CBC WITH DIFFERENTIAL/PLATELET
BASOS ABS: 74 {cells}/uL (ref 0–200)
Basophils Relative: 0.6 %
EOS ABS: 161 {cells}/uL (ref 15–500)
Eosinophils Relative: 1.3 %
HEMATOCRIT: 39.4 % (ref 35.0–45.0)
Hemoglobin: 13.6 g/dL (ref 11.7–15.5)
Lymphs Abs: 3695 cells/uL (ref 850–3900)
MCH: 28.3 pg (ref 27.0–33.0)
MCHC: 34.5 g/dL (ref 32.0–36.0)
MCV: 82.1 fL (ref 80.0–100.0)
MPV: 9.3 fL (ref 7.5–12.5)
Monocytes Relative: 4.3 %
NEUTROS PCT: 64 %
Neutro Abs: 7936 cells/uL — ABNORMAL HIGH (ref 1500–7800)
Platelets: 444 10*3/uL — ABNORMAL HIGH (ref 140–400)
RBC: 4.8 10*6/uL (ref 3.80–5.10)
RDW: 13.4 % (ref 11.0–15.0)
Total Lymphocyte: 29.8 %
WBC: 12.4 10*3/uL — AB (ref 3.8–10.8)
WBCMIX: 533 {cells}/uL (ref 200–950)

## 2017-08-15 LAB — COMPREHENSIVE METABOLIC PANEL
AG Ratio: 1.7 (calc) (ref 1.0–2.5)
ALKALINE PHOSPHATASE (APISO): 76 U/L (ref 33–130)
ALT: 15 U/L (ref 6–29)
AST: 13 U/L (ref 10–35)
Albumin: 4.6 g/dL (ref 3.6–5.1)
BILIRUBIN TOTAL: 0.3 mg/dL (ref 0.2–1.2)
BUN: 17 mg/dL (ref 7–25)
CALCIUM: 10.6 mg/dL — AB (ref 8.6–10.4)
CO2: 21 mmol/L (ref 20–32)
CREATININE: 0.54 mg/dL (ref 0.50–1.05)
Chloride: 103 mmol/L (ref 98–110)
Globulin: 2.7 g/dL (calc) (ref 1.9–3.7)
Glucose, Bld: 167 mg/dL — ABNORMAL HIGH (ref 65–99)
Potassium: 4.1 mmol/L (ref 3.5–5.3)
Sodium: 142 mmol/L (ref 135–146)
Total Protein: 7.3 g/dL (ref 6.1–8.1)

## 2017-08-15 LAB — HEMOGLOBIN A1C
EAG (MMOL/L): 10.4 (calc)
Hgb A1c MFr Bld: 8.2 % of total Hgb — ABNORMAL HIGH (ref <5.7)
MEAN PLASMA GLUCOSE: 189 (calc)

## 2017-08-15 LAB — BRAIN NATRIURETIC PEPTIDE: BRAIN NATRIURETIC PEPTIDE: 30 pg/mL (ref <100)

## 2017-08-15 NOTE — Assessment & Plan Note (Signed)
Blood pressure is controlled She looks mildly dehydrated she has not had much to eat or drink today Check renal function, consider giving lasix for pedal edema, will see what labs look like first, encourage oral hydration  No change to BP med at this time

## 2017-08-15 NOTE — Assessment & Plan Note (Signed)
Random glucose normal in office As she has not checked during these episodes possible some glucose fluctations? Though does not explain the peripheral edema Check intermin A1C has been 6 weeks since her last one Diabetes has been uncontrolled for quite some time.

## 2017-08-30 ENCOUNTER — Ambulatory Visit: Payer: 59 | Admitting: Family Medicine

## 2017-08-30 ENCOUNTER — Encounter: Payer: Self-pay | Admitting: Family Medicine

## 2017-08-30 ENCOUNTER — Other Ambulatory Visit: Payer: Self-pay

## 2017-08-30 VITALS — BP 128/64 | HR 92 | Temp 98.6°F | Resp 16 | Ht 64.0 in | Wt 194.0 lb

## 2017-08-30 DIAGNOSIS — M791 Myalgia, unspecified site: Secondary | ICD-10-CM | POA: Diagnosis not present

## 2017-08-30 DIAGNOSIS — R5382 Chronic fatigue, unspecified: Secondary | ICD-10-CM

## 2017-08-30 DIAGNOSIS — M2550 Pain in unspecified joint: Secondary | ICD-10-CM

## 2017-08-30 DIAGNOSIS — G4730 Sleep apnea, unspecified: Secondary | ICD-10-CM | POA: Diagnosis not present

## 2017-08-30 NOTE — Patient Instructions (Addendum)
Referral for sleep study  We will call with lab results F/U as previous

## 2017-08-30 NOTE — Progress Notes (Signed)
Subjective:    Patient ID: Sheena Ruiz, female    DOB: 01/15/1961, 57 y.o.   MRN: [id number]  Patient presents for Follow-up (is not fasting)   She was talking with her husband about her symptoms through the years, a few concerns came up and she is here for evaluation/testing   Snores a lot,husband noticed that she gasping  for air, headaches in the morning and has chronic fatigue, concerned about OSA   Concerned about possible chronic viral illness   Asked to have EBV/CMV/autoimmune diseases  In 1989 she returned from [place], had an illness of some sort where she  had difficulty walking, joint pain, saw rheumatology Dr. Alexandria Lodge told not autoimmune likley something viral. She, has had problems with stamina and fatigue since then. She also gets these "flares" similar to when she came in 2 weeks ago where she feels sick, gets joint pain, muslcle pain, sore throat, cant tolerate sun/heat, has photosenstivity, sometimes more severe than others, at one point had a few flares a year, She had some initial work up for auto immune again, after finding she has chronically elevated SED rate, but then was unable to return to complete work updiagnosis She has changed lifestyle more recently with diet and exercise in setting of her diabetes but still feels fatigued   No autominnune in family    weight down 5lbs and blood sugars much improved with addition of jardiance          Review Of Systems:  GEN- +fatigue, fever, weight loss,weakness, recent illness HEENT- denies eye drainage, change in vision, nasal discharge, CVS- denies chest pain, palpitations RESP- denies SOB, cough, wheeze ABD- denies N/V, change in stools, abd pain GU- denies dysuria, hematuria, dribbling, incontinence MSK- denies joint pain, +muscle aches, injury Neuro- denies headache, dizziness, syncope, seizure activity       Objective:    BP 128/64   Pulse 92   Temp 98.6 F (37 C) (Oral)   Resp 16   Ht 5'  4" (1.626 m)   Wt 194 lb (88 kg)   LMP 03/06/2011   SpO2 92%   BMI 33.30 kg/m  GEN- NAD, alert and oriented x3 HEENT- PERRL, EOMI, non injected sclera, pink conjunctiva, MMM, oropharynx clear Neck- Supple, no thyromegaly CVS- RRR, no murmur RESP-CTAB EXT- trace at ankles edema Pulses- Radial, DP- 2+        Assessment & Plan:      Problem List Items Addressed This Visit    None    Visit Diagnoses    Myalgia    -  Primary   difficult with her flares, she had elevated WBC last visit ,? symptoms more viral unclear about possible chronic EBV/Lyme vs autoimmune, will restart work up for this See if this is determined as chronic illness vs automimmune disorder Depending on results send to rheumatology or ID She also has treatment for depression in itself which can cause some of these symptoms as well    Relevant Orders   Rheumatoid factor (Completed)   ANA, IFA Comprehensive Panel   Chronic fatigue       Relevant Orders   Epstein-Barr virus VCA antibody panel   CMV abs, IgG+IgM (cytomegalovirus)   B. burgdorfi antibodies   C-reactive protein (Completed)   Sedimentation Rate (Completed)   Arthralgia, unspecified joint       Relevant Orders   Epstein-Barr virus VCA antibody panel   CMV abs, IgG+IgM (cytomegalovirus)   B. burgdorfi antibodies  C-reactive protein (Completed)   Rheumatoid factor (Completed)   ANA, IFA Comprehensive Panel   CBC with Differential/Platelet (Completed)   Basic metabolic panel (Completed)   Sleep apnea, unspecified type       she is obese,with apnea symptoms, obtain sleep study   Relevant Orders   Home sleep test      Note: This dictation was prepared with Dragon dictation along with smaller phrase technology. Any transcriptional errors that result from this process are unintentional.

## 2017-08-31 ENCOUNTER — Encounter: Payer: Self-pay | Admitting: Family Medicine

## 2017-09-03 LAB — CMV ABS, IGG+IGM (CYTOMEGALOVIRUS)
CMV IgM: <30 AU/mL
Cytomegalovirus Ab-IgG: 6.9 U/mL — ABNORMAL HIGH

## 2017-09-03 LAB — EPSTEIN-BARR VIRUS VCA ANTIBODY PANEL
EBV NA IGG: 89.9 U/mL — AB
EBV VCA IGG: 542 U/mL — AB
EBV VCA IgM: <36 U/mL

## 2017-09-03 LAB — BASIC METABOLIC PANEL
BUN: 15 mg/dL (ref 7–25)
CALCIUM: 9.6 mg/dL (ref 8.6–10.4)
CO2: 24 mmol/L (ref 20–32)
Chloride: 104 mmol/L (ref 98–110)
Creat: 0.63 mg/dL (ref 0.50–1.05)
GLUCOSE: 174 mg/dL — AB (ref 65–99)
Potassium: 3.7 mmol/L (ref 3.5–5.3)
SODIUM: 141 mmol/L (ref 135–146)

## 2017-09-03 LAB — CBC WITH DIFFERENTIAL/PLATELET
BASOS PCT: 0.7 %
Basophils Absolute: 75 cells/uL (ref 0–200)
EOS PCT: 1.4 %
Eosinophils Absolute: 150 cells/uL (ref 15–500)
HCT: 37.2 % (ref 35.0–45.0)
HEMOGLOBIN: 13 g/dL (ref 11.7–15.5)
Lymphs Abs: 3702 cells/uL (ref 850–3900)
MCH: 28.8 pg (ref 27.0–33.0)
MCHC: 34.9 g/dL (ref 32.0–36.0)
MCV: 82.3 fL (ref 80.0–100.0)
MONOS PCT: 5.3 %
MPV: 9.3 fL (ref 7.5–12.5)
NEUTROS ABS: 6206 {cells}/uL (ref 1500–7800)
Neutrophils Relative %: 58 %
Platelets: 444 10*3/uL — ABNORMAL HIGH (ref 140–400)
RBC: 4.52 10*6/uL (ref 3.80–5.10)
RDW: 13.5 % (ref 11.0–15.0)
Total Lymphocyte: 34.6 %
WBC mixed population: 567 cells/uL (ref 200–950)
WBC: 10.7 10*3/uL (ref 3.8–10.8)

## 2017-09-03 LAB — RHEUMATOID FACTOR: Rhuematoid fact SerPl-aCnc: <14 IU/mL (ref <14)

## 2017-09-03 LAB — SEDIMENTATION RATE: SED RATE: 50 mm/h — AB (ref 0–30)

## 2017-09-03 LAB — ANA, IFA COMPREHENSIVE PANEL
Anti Nuclear Antibody(ANA): NEGATIVE
ENA SM AB SER-ACNC: NEGATIVE AI
SCLERODERMA (SCL-70) (ENA) ANTIBODY, IGG: NEGATIVE AI
SM/RNP: NEGATIVE AI
SSA (RO) (ENA) ANTIBODY, IGG: NEGATIVE AI
SSB (La) (ENA) Antibody, IgG: <1 AI
ds DNA Ab: <1 IU/mL

## 2017-09-03 LAB — C-REACTIVE PROTEIN: CRP: 8.3 mg/L — AB (ref <8.0)

## 2017-09-03 LAB — B. BURGDORFI ANTIBODIES

## 2017-09-04 ENCOUNTER — Other Ambulatory Visit: Payer: Self-pay | Admitting: *Deleted

## 2017-09-04 MED ORDER — EMPAGLIFLOZIN 25 MG PO TABS: 25.0000 mg | ORAL_TABLET | Freq: Every day | ORAL | 3 refills | Status: DC

## 2017-09-05 ENCOUNTER — Other Ambulatory Visit: Payer: Self-pay | Admitting: *Deleted

## 2017-09-05 DIAGNOSIS — M791 Myalgia, unspecified site: Secondary | ICD-10-CM

## 2017-09-05 DIAGNOSIS — R700 Elevated erythrocyte sedimentation rate: Secondary | ICD-10-CM

## 2017-09-10 ENCOUNTER — Encounter: Payer: Self-pay | Admitting: Family Medicine

## 2017-09-22 ENCOUNTER — Other Ambulatory Visit: Payer: Self-pay | Admitting: Family Medicine

## 2017-09-24 DIAGNOSIS — R700 Elevated erythrocyte sedimentation rate: Secondary | ICD-10-CM | POA: Diagnosis not present

## 2017-09-24 DIAGNOSIS — D72829 Elevated white blood cell count, unspecified: Secondary | ICD-10-CM | POA: Diagnosis not present

## 2017-09-24 DIAGNOSIS — M791 Myalgia, unspecified site: Secondary | ICD-10-CM | POA: Diagnosis not present

## 2017-10-02 ENCOUNTER — Ambulatory Visit: Payer: 59 | Admitting: Family Medicine

## 2017-10-14 ENCOUNTER — Other Ambulatory Visit: Payer: Self-pay | Admitting: Family Medicine

## 2017-10-14 ENCOUNTER — Telehealth: Payer: Self-pay | Admitting: Family Medicine

## 2017-10-14 DIAGNOSIS — G4730 Sleep apnea, unspecified: Secondary | ICD-10-CM

## 2017-10-14 NOTE — Progress Notes (Signed)
Order for sleep study with [place] sleep lab has been placed.

## 2017-10-14 NOTE — Telephone Encounter (Signed)
Order for sleep study was placed per Dr. Buelah Manis. I have selected [facility] as patient lives in [place]. Awaiting sleep lab to schedule

## 2017-11-13 DIAGNOSIS — G4733 Obstructive sleep apnea (adult) (pediatric): Secondary | ICD-10-CM | POA: Diagnosis not present

## 2017-12-02 ENCOUNTER — Ambulatory Visit: Payer: 59 | Admitting: Physician Assistant

## 2017-12-08 ENCOUNTER — Encounter (HOSPITAL_BASED_OUTPATIENT_CLINIC_OR_DEPARTMENT_OTHER): Payer: No Typology Code available for payment source

## 2017-12-09 ENCOUNTER — Telehealth: Payer: Self-pay | Admitting: Family Medicine

## 2017-12-09 NOTE — Telephone Encounter (Signed)
Call placed to patient. LMTRC.  

## 2017-12-09 NOTE — Telephone Encounter (Signed)
Call patient, I reviewed her sleep study,   She does not meet criteria for CPAP Therapy, she has some mild sleep apnea symptoms.  Recommendations are weight loss , which she is working on, she can also be sent to ENT for a mouth device or other options.  Okay to send referral if needed, send sleep report with referral.

## 2017-12-10 NOTE — Telephone Encounter (Signed)
Call placed to patient. LMTRC.  

## 2017-12-11 NOTE — Telephone Encounter (Signed)
Multiple calls placed to patient with no answer and no return call.   Message to be closed.  

## 2017-12-28 ENCOUNTER — Other Ambulatory Visit: Payer: Self-pay | Admitting: Family Medicine

## 2017-12-29 ENCOUNTER — Other Ambulatory Visit: Payer: Self-pay | Admitting: Family Medicine

## 2018-01-28 ENCOUNTER — Other Ambulatory Visit: Payer: Self-pay | Admitting: Family Medicine

## 2018-02-23 ENCOUNTER — Other Ambulatory Visit: Payer: Self-pay | Admitting: Family Medicine

## 2018-02-27 ENCOUNTER — Telehealth: Payer: Self-pay | Admitting: *Deleted

## 2018-02-27 NOTE — Telephone Encounter (Signed)
Received call from patient.   Reports that Name Brand Effexor has co-pay at this time of $300. Requested Tier Exception.   Call placed to pharmacy. Was advised that current cost is due to insurance deductible. Tier Exception is still in effect until 2022.  Call placed to patient. [place].

## 2018-02-28 NOTE — Telephone Encounter (Signed)
Call placed to patient and patient made aware per VM.  

## 2018-03-04 ENCOUNTER — Encounter: Payer: Self-pay | Admitting: Family Medicine

## 2018-03-04 ENCOUNTER — Other Ambulatory Visit: Payer: Self-pay

## 2018-03-04 ENCOUNTER — Ambulatory Visit (INDEPENDENT_AMBULATORY_CARE_PROVIDER_SITE_OTHER): Payer: No Typology Code available for payment source | Admitting: Family Medicine

## 2018-03-04 VITALS — BP 126/72 | HR 66 | Temp 98.6°F | Resp 14 | Ht 64.0 in | Wt 191.0 lb

## 2018-03-04 DIAGNOSIS — N761 Subacute and chronic vaginitis: Secondary | ICD-10-CM | POA: Diagnosis not present

## 2018-03-04 DIAGNOSIS — I10 Essential (primary) hypertension: Secondary | ICD-10-CM

## 2018-03-04 DIAGNOSIS — E119 Type 2 diabetes mellitus without complications: Secondary | ICD-10-CM | POA: Diagnosis not present

## 2018-03-04 DIAGNOSIS — R809 Proteinuria, unspecified: Secondary | ICD-10-CM | POA: Diagnosis not present

## 2018-03-04 DIAGNOSIS — Z1239 Encounter for other screening for malignant neoplasm of breast: Secondary | ICD-10-CM

## 2018-03-04 DIAGNOSIS — E669 Obesity, unspecified: Secondary | ICD-10-CM

## 2018-03-04 DIAGNOSIS — R69 Illness, unspecified: Secondary | ICD-10-CM | POA: Diagnosis not present

## 2018-03-04 DIAGNOSIS — R350 Frequency of micturition: Secondary | ICD-10-CM | POA: Diagnosis not present

## 2018-03-04 DIAGNOSIS — N951 Menopausal and female climacteric states: Secondary | ICD-10-CM

## 2018-03-04 DIAGNOSIS — Z23 Encounter for immunization: Secondary | ICD-10-CM | POA: Diagnosis not present

## 2018-03-04 DIAGNOSIS — F3340 Major depressive disorder, recurrent, in remission, unspecified: Secondary | ICD-10-CM

## 2018-03-04 LAB — WET PREP FOR TRICH, YEAST, CLUE

## 2018-03-04 LAB — URINALYSIS, ROUTINE W REFLEX MICROSCOPIC
BACTERIA UA: NONE SEEN /HPF
BILIRUBIN URINE: NEGATIVE
Glucose, UA: NEGATIVE
Ketones, ur: NEGATIVE
Leukocytes, UA: NEGATIVE
NITRITE: NEGATIVE
SPECIFIC GRAVITY, URINE: 1.025 (ref 1.001–1.03)
WBC, UA: NONE SEEN /HPF (ref 0–5)
pH: 6 (ref 5.0–8.0)

## 2018-03-04 LAB — MICROSCOPIC MESSAGE

## 2018-03-04 MED ORDER — METFORMIN HCL ER (OSM) 1000 MG PO TB24: 2000.0000 mg | ORAL_TABLET | Freq: Every day | ORAL | 2 refills | Status: DC

## 2018-03-04 MED ORDER — NYSTATIN 100000 UNIT/GM EX CREA: 1.0000 "application " | TOPICAL_CREAM | Freq: Two times a day (BID) | CUTANEOUS | 0 refills | Status: DC

## 2018-03-04 NOTE — Patient Instructions (Addendum)
Schedule your mammogram Return for fasting cholesterol- LAB VISIT Use the nystatin cream Stop Jardiance    F/U 3 months  Physical

## 2018-03-04 NOTE — Assessment & Plan Note (Signed)
Controlled no changes to medications 

## 2018-03-04 NOTE — Assessment & Plan Note (Signed)
Chronic uncontrolled diabetes, she does not follow up well either. Severe yeast infection with Jardiance, after further discussion will d/c and start Trulicity in conjunction with her metformin, which will be changed to extended release since she forgets to take her evening dose of metformin Goal still A1C of 7%, but compliance with appointments and medications ongoing issue for her

## 2018-03-04 NOTE — Assessment & Plan Note (Signed)
Nurse spent 20 minutes on phone with insurance and pharmacy getting her effexor covered No change to medication

## 2018-03-04 NOTE — Progress Notes (Signed)
Subjective:    Patient ID: Sheena Ruiz, female    DOB: Apr 14, 1960, 58 y.o.   MRN: [id number]  Patient presents for DM (stopped jardiance d/t yeast infections); Hormone Check; and Pain (urinary/ pelvic pain)  Patient here to follow-up chronic medical problems.  She has not been seen since May.  She is canceled a few different appointments.  She states that she has been helping her family members.  She is been having difficulties with recurrent yeast infections and urinary frequency.  She did take Diflucan prescribed by her husband however it did not improved.  She stopped the Jardiance 10 days ago.  Her symptoms have improved since then but she does still have some urinary frequency and some pressure.  She denies any blood in the urine.  States initially when she had a severe yeast infection she would have some mild spotting when she would wipe the more from irritation.  DM- would like to switch to extended release MTF she does sometimes forget to take her evening dose.  Lost her meter again per report has not been checking her blood sugars.  But she states that she felt good with the Jardiance because she was losing weight.  Last A1c 9.4%  Post menopausal syndrome -    Review Of Systems: per above  GEN- denies fatigue, fever, weight loss,weakness, recent illness HEENT- denies eye drainage, change in vision, nasal discharge, CVS- denies chest pain, palpitations RESP- denies SOB, cough, wheeze ABD- denies N/V, change in stools, abd pain GU- denies dysuria, hematuria, dribbling, incontinence MSK- denies joint pain, muscle aches, injury Neuro- denies headache, dizziness, syncope, seizure activity       Objective:    BP 126/72   Pulse 66   Temp 98.6 F (37 C) (Oral)   Resp 14   Ht 5\' 4"  (1.626 m)   Wt 191 lb (86.6 kg)   LMP 03/06/2011   SpO2 97%   BMI 32.79 kg/m  GEN- NAD, alert and oriented x3 HEENT- PERRL, EOMI, non injected sclera, pink conjunctiva, MMM, oropharynx  clear Neck- Supple, no thyromegaly CVS- RRR, no murmur RESP-CTAB ABD-NABS,soft,NT,ND GEN- NAD, alert and oriented, Neck- supple, no thyromegaly Breast- normal symmetry, no nipple inversion,no nipple drainage, no nodules or lumps felt Nodes- no axillary nodes GU- normal external genitalia, erythema and irritation of labia, vaginal mucosa pink and moist some atrophy noted cervix visualized no growth, no blood form os, minimal thin clear discharge, no CMT, , uterus normal size EXT- No edema Pulses- Radial, DP- 2+        Assessment & Plan:      Problem List Items Addressed This Visit      Unprioritized   Depression    Nurse spent 20 minutes on phone with insurance and pharmacy getting her effexor covered No change to medication      Diabetes ([place])    Chronic uncontrolled diabetes, she does not follow up well either. Severe yeast infection with Jardiance, after further discussion will d/c and start Trulicity in conjunction with her metformin, which will be changed to extended release since she forgets to take her evening dose of metformin Goal still A1C of 7%, but compliance with appointments and medications ongoing issue for her      Relevant Orders   CBC with Differential/Platelet   Comprehensive metabolic panel   Hemoglobin A1c   Microalbumin / creatinine urine ratio   Hypertension    Controlled no changes to medications      Obesity (BMI  30.0-34.9)    Other Visit Diagnoses    Frequent urination    -  Primary   no overwhelming UTI noted on UA, send for culture, protein noted, check  urine microalbumin   Relevant Orders   Urinalysis, Routine w reflex microscopic (Completed)   Urine Culture   Breast cancer screening       Will not RX estrogen, progresterone combo until mammogram done   Relevant Orders   MM 3D SCREEN BREAST BILATERAL   Subacute vaginitis       nystatin externally, wet prep negative   Relevant Orders   WET PREP FOR TRICH, YEAST, CLUE (Completed)    Post menopausal syndrome       Proteinuria, unspecified type       Need for immunization against influenza       Relevant Orders   Flu Vaccine QUAD 36+ mos IM (Completed)      Note: This dictation was prepared with Dragon dictation along with smaller phrase technology. Any transcriptional errors that result from this process are unintentional.

## 2018-03-05 ENCOUNTER — Ambulatory Visit
Admission: RE | Admit: 2018-03-05 | Discharge: 2018-03-05 | Disposition: A | Discharge status: Home / Self Care | Payer: No Typology Code available for payment source | Source: Ambulatory Visit | Attending: Family Medicine | Admitting: Family Medicine

## 2018-03-05 DIAGNOSIS — Z1239 Encounter for other screening for malignant neoplasm of breast: Secondary | ICD-10-CM

## 2018-03-05 DIAGNOSIS — Z1231 Encounter for screening mammogram for malignant neoplasm of breast: Secondary | ICD-10-CM | POA: Diagnosis not present

## 2018-03-05 LAB — MICROALBUMIN / CREATININE URINE RATIO
Creatinine, Urine: 234 mg/dL (ref 20–275)
Microalb Creat Ratio: 13 mcg/mg creat (ref <30)
Microalb, Ur: 3.1 mg/dL

## 2018-03-05 LAB — URINE CULTURE
MICRO NUMBER:: 53009
SPECIMEN QUALITY: ADEQUATE

## 2018-03-05 NOTE — Addendum Note (Signed)
Addended by: Vic Blackbird F on: 03/05/2018 08:11 AM   Modules accepted: Orders

## 2018-03-06 NOTE — Telephone Encounter (Signed)
Late documentation for 03/05/2018:  Received call from patient. Reports that she tried to pick up medication from pharmacy and co-pay remains >$300.  Call placed to pharmacy. States that they are still receiving message that insurance will only pay for small amount of prescription.   Call placed to [facility]. >1 hour spent on the call being transferred or disconnected and having to call back to start process again. Finally spoke with Rosanna Randy in provider services who advised that no new claim for medication has been placed from pharmacy. The only claim noted is dated 02/24/2018. Advised to have pharmacy clear prior claim and re-submit.   Call placed to pharmacy and spoke with [facility]. Advised of insurance recommendations. Old claim cleared from system and new claim submitted. Medication completely covered at this time.   Call placed to patient. [place].

## 2018-03-06 NOTE — Telephone Encounter (Signed)
Late documentation for 03/04/2018:  Patient seen in office for appointment. Discussed current tier exception is still active. Reports that she contacted insurance company and was advised that a DAW waiver would need to be filed.   Call placed to [place]. Was informed that DAW waiver is the tier exception. Re-submitted with prior case information. Again approved 03/04/2018- 03/04/2021. Approval faxed to office. Patient made aware prior to leaving office.

## 2018-03-07 ENCOUNTER — Other Ambulatory Visit: Payer: Self-pay | Admitting: Family Medicine

## 2018-03-07 DIAGNOSIS — R928 Other abnormal and inconclusive findings on diagnostic imaging of breast: Secondary | ICD-10-CM

## 2018-03-07 NOTE — Telephone Encounter (Signed)
Call placed to patient and patient made aware.  

## 2018-03-10 ENCOUNTER — Other Ambulatory Visit: Payer: No Typology Code available for payment source

## 2018-03-10 DIAGNOSIS — E119 Type 2 diabetes mellitus without complications: Secondary | ICD-10-CM | POA: Diagnosis not present

## 2018-03-11 LAB — COMPREHENSIVE METABOLIC PANEL
AG RATIO: 1.8 (calc) (ref 1.0–2.5)
ALBUMIN MSPROF: 4.4 g/dL (ref 3.6–5.1)
ALKALINE PHOSPHATASE (APISO): 62 U/L (ref 33–130)
ALT: 13 U/L (ref 6–29)
AST: 12 U/L (ref 10–35)
BILIRUBIN TOTAL: 0.5 mg/dL (ref 0.2–1.2)
BUN: 14 mg/dL (ref 7–25)
CALCIUM: 10.1 mg/dL (ref 8.6–10.4)
CHLORIDE: 101 mmol/L (ref 98–110)
CO2: 29 mmol/L (ref 20–32)
CREATININE: 0.61 mg/dL (ref 0.50–1.05)
GLOBULIN: 2.4 g/dL (ref 1.9–3.7)
Glucose, Bld: 167 mg/dL — ABNORMAL HIGH (ref 65–99)
POTASSIUM: 4.2 mmol/L (ref 3.5–5.3)
Sodium: 142 mmol/L (ref 135–146)
Total Protein: 6.8 g/dL (ref 6.1–8.1)

## 2018-03-11 LAB — CBC WITH DIFFERENTIAL/PLATELET
ABSOLUTE MONOCYTES: 523 {cells}/uL (ref 200–950)
BASOS PCT: 0.7 %
Basophils Absolute: 76 cells/uL (ref 0–200)
EOS ABS: 142 {cells}/uL (ref 15–500)
Eosinophils Relative: 1.3 %
HEMATOCRIT: 40.2 % (ref 35.0–45.0)
HEMOGLOBIN: 13.4 g/dL (ref 11.7–15.5)
LYMPHS ABS: 3586 {cells}/uL (ref 850–3900)
MCH: 27.9 pg (ref 27.0–33.0)
MCHC: 33.3 g/dL (ref 32.0–36.0)
MCV: 83.6 fL (ref 80.0–100.0)
MPV: 9.4 fL (ref 7.5–12.5)
Monocytes Relative: 4.8 %
Neutro Abs: 6573 cells/uL (ref 1500–7800)
Neutrophils Relative %: 60.3 %
PLATELETS: 395 10*3/uL (ref 140–400)
RBC: 4.81 10*6/uL (ref 3.80–5.10)
RDW: 13.4 % (ref 11.0–15.0)
TOTAL LYMPHOCYTE: 32.9 %
WBC: 10.9 10*3/uL — AB (ref 3.8–10.8)

## 2018-03-11 LAB — LIPID PANEL
Cholesterol: 204 mg/dL — ABNORMAL HIGH (ref <200)
HDL: 41 mg/dL — ABNORMAL LOW (ref >50)
LDL Cholesterol (Calc): 121 mg/dL (calc) — ABNORMAL HIGH
Non-HDL Cholesterol (Calc): 163 mg/dL (calc) — ABNORMAL HIGH (ref <130)
Total CHOL/HDL Ratio: 5 (calc) — ABNORMAL HIGH (ref <5.0)
Triglycerides: 296 mg/dL — ABNORMAL HIGH (ref <150)

## 2018-03-11 LAB — HEMOGLOBIN A1C
EAG (MMOL/L): 9.7 (calc)
Hgb A1c MFr Bld: 7.7 % of total Hgb — ABNORMAL HIGH (ref <5.7)
MEAN PLASMA GLUCOSE: 174 (calc)

## 2018-03-13 ENCOUNTER — Other Ambulatory Visit: Payer: Self-pay | Admitting: *Deleted

## 2018-03-13 MED ORDER — DULAGLUTIDE 0.75 MG/0.5ML ~~LOC~~ SOAJ: 0.7500 mg | SUBCUTANEOUS | 11 refills | Status: DC

## 2018-03-14 ENCOUNTER — Other Ambulatory Visit: Payer: Self-pay | Admitting: *Deleted

## 2018-03-14 ENCOUNTER — Ambulatory Visit
Admission: RE | Admit: 2018-03-14 | Discharge: 2018-03-14 | Disposition: A | Discharge status: Home / Self Care | Payer: No Typology Code available for payment source | Source: Ambulatory Visit | Attending: Family Medicine | Admitting: Family Medicine

## 2018-03-14 ENCOUNTER — Other Ambulatory Visit: Payer: Self-pay | Admitting: Family Medicine

## 2018-03-14 DIAGNOSIS — R928 Other abnormal and inconclusive findings on diagnostic imaging of breast: Secondary | ICD-10-CM | POA: Diagnosis not present

## 2018-03-14 DIAGNOSIS — N6489 Other specified disorders of breast: Secondary | ICD-10-CM | POA: Diagnosis not present

## 2018-03-14 DIAGNOSIS — N6320 Unspecified lump in the left breast, unspecified quadrant: Secondary | ICD-10-CM

## 2018-03-14 MED ORDER — CEPHALEXIN 500 MG PO CAPS: 500.0000 mg | ORAL_CAPSULE | Freq: Four times a day (QID) | ORAL | 0 refills | Status: AC

## 2018-03-21 ENCOUNTER — Ambulatory Visit
Admission: RE | Admit: 2018-03-21 | Discharge: 2018-03-21 | Disposition: A | Discharge status: Home / Self Care | Payer: No Typology Code available for payment source | Source: Ambulatory Visit | Attending: Family Medicine | Admitting: Family Medicine

## 2018-03-21 ENCOUNTER — Telehealth: Payer: Self-pay | Admitting: *Deleted

## 2018-03-21 DIAGNOSIS — N6320 Unspecified lump in the left breast, unspecified quadrant: Secondary | ICD-10-CM

## 2018-03-21 DIAGNOSIS — N6322 Unspecified lump in the left breast, upper inner quadrant: Secondary | ICD-10-CM | POA: Diagnosis not present

## 2018-03-21 DIAGNOSIS — Z170 Estrogen receptor positive status [ER+]: Secondary | ICD-10-CM | POA: Diagnosis not present

## 2018-03-21 DIAGNOSIS — C50212 Malignant neoplasm of upper-inner quadrant of left female breast: Secondary | ICD-10-CM | POA: Diagnosis not present

## 2018-03-21 HISTORY — PX: BREAST BIOPSY: SHX20

## 2018-03-21 NOTE — Telephone Encounter (Signed)
Received call from patient.   Reports that she has been attempting to take the Keflex as prescribed, but QID dosing is too much for her stomach. States that she has been having N/V/D and abd cramps. Reports that she has alternated with TID and BID dosing.   Reports that ABTx has helped urinary Sx.   MD made aware and advised that as long as she has taken BID x7, it should treat interstitial cystitis.   Call placed to patient and patient made aware. Verbalized understanding.

## 2018-03-26 DIAGNOSIS — C50212 Malignant neoplasm of upper-inner quadrant of left female breast: Secondary | ICD-10-CM | POA: Diagnosis not present

## 2018-03-26 DIAGNOSIS — Z170 Estrogen receptor positive status [ER+]: Secondary | ICD-10-CM | POA: Diagnosis not present

## 2018-03-28 ENCOUNTER — Ambulatory Visit: Payer: Self-pay | Admitting: General Surgery

## 2018-03-28 DIAGNOSIS — Z170 Estrogen receptor positive status [ER+]: Principal | ICD-10-CM

## 2018-03-28 DIAGNOSIS — C50212 Malignant neoplasm of upper-inner quadrant of left female breast: Secondary | ICD-10-CM

## 2018-03-31 ENCOUNTER — Telehealth: Payer: Self-pay | Admitting: Hematology and Oncology

## 2018-03-31 DIAGNOSIS — J029 Acute pharyngitis, unspecified: Secondary | ICD-10-CM | POA: Diagnosis not present

## 2018-03-31 NOTE — Telephone Encounter (Signed)
A medonc appt has been scheduled for the pt to see Dr. Lindi Adie on 2/11 at 1pm and for an urgent genetics appt on 2/11 at 3pm.

## 2018-04-01 ENCOUNTER — Inpatient Hospital Stay (HOSPITAL_BASED_OUTPATIENT_CLINIC_OR_DEPARTMENT_OTHER): Payer: No Typology Code available for payment source | Admitting: Hematology and Oncology

## 2018-04-01 ENCOUNTER — Inpatient Hospital Stay
Payer: No Typology Code available for payment source | Attending: Hematology and Oncology | Admitting: Licensed Clinical Social Worker

## 2018-04-01 ENCOUNTER — Inpatient Hospital Stay: Payer: No Typology Code available for payment source

## 2018-04-01 ENCOUNTER — Encounter: Payer: Self-pay | Admitting: *Deleted

## 2018-04-01 DIAGNOSIS — E119 Type 2 diabetes mellitus without complications: Secondary | ICD-10-CM

## 2018-04-01 DIAGNOSIS — Z803 Family history of malignant neoplasm of breast: Secondary | ICD-10-CM

## 2018-04-01 DIAGNOSIS — Z79899 Other long term (current) drug therapy: Secondary | ICD-10-CM | POA: Insufficient documentation

## 2018-04-01 DIAGNOSIS — Z8041 Family history of malignant neoplasm of ovary: Secondary | ICD-10-CM | POA: Diagnosis not present

## 2018-04-01 DIAGNOSIS — Z8049 Family history of malignant neoplasm of other genital organs: Secondary | ICD-10-CM

## 2018-04-01 DIAGNOSIS — C50212 Malignant neoplasm of upper-inner quadrant of left female breast: Secondary | ICD-10-CM

## 2018-04-01 DIAGNOSIS — Z170 Estrogen receptor positive status [ER+]: Secondary | ICD-10-CM

## 2018-04-01 DIAGNOSIS — Z7982 Long term (current) use of aspirin: Secondary | ICD-10-CM | POA: Diagnosis not present

## 2018-04-01 DIAGNOSIS — I10 Essential (primary) hypertension: Secondary | ICD-10-CM

## 2018-04-01 DIAGNOSIS — Z800 Family history of malignant neoplasm of digestive organs: Secondary | ICD-10-CM

## 2018-04-01 DIAGNOSIS — Z7984 Long term (current) use of oral hypoglycemic drugs: Secondary | ICD-10-CM | POA: Insufficient documentation

## 2018-04-01 NOTE — Progress Notes (Signed)
[facility] CONSULT NOTE  Patient Care Team: [facility] [place], Modena Nunnery, MD as PCP - General (Family Medicine)  CHIEF COMPLAINTS/PURPOSE OF CONSULTATION: Newly diagnosed breast cancer   HISTORY OF PRESENTING ILLNESS:  Sheena Ruiz 58 y.o. 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 The cancer was detected on a routine screening mammogram on 03/05/18. A diagnostic mammogram from 03/14/18 showed a 57m mass in the 11:30 position in the left breast 5cm from the nipple. A biopsy from 03/21/18 showed the cancer to be grade 1, HER2 negative, ER 100%, PR 10%, Ki67 2%.   She presents to the clinic alone today. She reports a family history of breast cancer and cervical cancer in several maternal and paternal cousins and aunts. She asked about alternatives for radiation and hormone suppression, as she is worried about side effects, in addition to cryotherapy and brachytherapy. She is currently in menopause and is attempting to lose weight. She takes care of 3 disabled relatives with psychological issues and dementia. She reviewed her medication list with me. 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   I reviewed her records extensively and collaborated the history with the patient.  SUMMARY OF ONCOLOGIC HISTORY:   Malignant neoplasm of upper-inner quadrant of left breast in [place], [place]   03/21/2018 Initial Diagnosis    Screening mammogram detected left breast mass 11:30 position 3 x 3 x 4 mm, axilla negative, biopsy revealed grade 1 IDC with DCIS ER 100%, PR 10%, Ki-67 2%, HER-2 -1+ by IHC, T1 a N0 stage Ia clinical stage    04/01/2018 Cancer Staging    Staging form: Breast, AJCC 8th Edition - Clinical stage from 04/01/2018: Stage IA (cT1a, cN0, cM0, G1, ER+, PR+, HER2-) - Signed by GNicholas Lose MD on 04/01/2018    MEDICAL HISTORY:  Past Medical History:  Diagnosis Date  . Complication  of anesthesia   . Depression   . Diabetes mellitus    TYPE 2  . Hearing loss    RIGHT EAR  . Hyperlipidemia   . Hypertension    SURGICAL HISTORY: Past Surgical History:  Procedure Laterality Date  . ankle replacement    . CESAREAN SECTION    . CHOLECYSTECTOMY    . COLONOSCOPY    . FOREIGN BODY REMOVAL EAR Right 09/24/2014   Procedure: REMOVAL FOREIGN BODY EAR EUA;  Surgeon: CBeverly Gust MD;  Location: [facility]  Service: ENT;  Laterality: Right;  DIABETIC-ORAL MEDS, EUA bilaterally  . TOTAL ANKLE REPLACEMENT    . TUBAL LIGATION      SOCIAL HISTORY: Social History   Socioeconomic History  . Marital status: Married    Spouse name: Not on file  . Number of children: 2  . Years of education: Not on file  . Highest education level: Not on file  Occupational History    Employer: [employer]  Social Needs  . Financial resource strain: Not hard at all  . Food insecurity:    Worry: Never true    Inability: Never true  . Transportation needs:    Medical: Not on file    Non-medical: Not on file  Tobacco Use  . Smoking status: Never Smoker  . Smokeless tobacco: Never Used  Substance and Sexual Activity  . Alcohol use: No  . Drug use: No  . Sexual activity: Yes    Birth control/protection: Surgical  Lifestyle  . Physical activity:  Days per week: 0 days    Minutes per session: 0 min  . Stress: To some extent  Relationships  . Social connections:    Talks on phone: More than three times a week    Gets together: More than three times a week    Attends religious service: More than 4 times per year    Active member of club or organization: No    Attends meetings of clubs or organizations: Never    Relationship status: Married  . Intimate partner violence:    Fear of current or ex partner: No    Emotionally abused: No    Physically abused: No    Forced sexual activity: No  Other Topics Concern  . Not on file  Social History Narrative  . Not on file     FAMILY HISTORY: Family History  Problem Relation Age of Onset  . Arthritis Mother   . Cancer Mother        SKIN  . Depression Mother   . Hypertension Mother   . Hyperlipidemia Mother   . Arthritis Father   . Cancer Father        SKIN  . Depression Father   . Stroke Father   . Hypertension Father   . Hyperlipidemia Father   . Vision loss Sister   . Hyperlipidemia Sister   . Asthma Brother   . COPD Brother   . Diabetes Brother   . Vision loss Brother   . Hypertension Brother   . Hyperlipidemia Brother   . Arthritis Maternal Grandmother   . Diabetes Maternal Grandmother   . Hyperlipidemia Maternal Grandmother   . Heart disease Maternal Grandmother   . Arthritis Maternal Grandfather   . Diabetes Maternal Grandfather   . Hyperlipidemia Maternal Grandfather   . Heart disease Maternal Grandfather   . Arthritis Paternal Grandmother   . Diabetes Paternal Grandmother   . Hyperlipidemia Paternal Grandmother   . Heart disease Paternal Grandmother   . Arthritis Paternal Grandfather   . Diabetes Paternal Grandfather   . Hyperlipidemia Paternal Grandfather   . Heart disease Paternal Grandfather     ALLERGIES:  is allergic to tricor [fenofibrate] and penicillins.  MEDICATIONS:  Current Outpatient Medications  Medication Sig Dispense Refill  . ALPRAZolam (XANAX) 0.5 MG tablet Take 1 tablet (0.5 mg total) by mouth at bedtime as needed for anxiety. (Patient not taking: Reported on 03/04/2018) 30 tablet 2  . aspirin EC 81 MG tablet Take 81 mg by mouth daily.    . Blood Glucose Monitoring Suppl (BLOOD GLUCOSE SYSTEM PAK) KIT Please dispense based on patient and insurance preference. Use as directed to monitor FSBS 2x daily. Dx: E11.9. 1 each 1  . Cinnamon 500 MG capsule Take 1,000 mg by mouth daily.    . Dulaglutide (TRULICITY) 4.09 WJ/1.9JY SOPN Inject 0.75 mg into the skin every 7 (seven) days. 4 pen 11  . EFFEXOR XR 150 MG 24 hr capsule TAKE 1 CAPSULE BY MOUTH DAILY 30 capsule  0  . ELDERBERRY PO Take 2 each by mouth.    . Glucose Blood (BLOOD GLUCOSE TEST STRIPS) STRP Please dispense based on patient and insurance preference. Use as directed to monitor FSBS 2x daily. Dx: E11.9. 100 each 1  . glucose blood (ONE TOUCH ULTRA TEST) test strip USE AS DIRECTED TWICE A DAY 100 each 3  . Lancet Devices MISC Please dispense based on patient and insurance preference. Use as directed to monitor FSBS 2x daily. Dx: E11.9. 1  each 1  . Lancets MISC Please dispense based on patient and insurance preference. Use as directed to monitor FSBS 2x daily. Dx: E11.9. 100 each 1  . Lysine 1000 MG TABS Take by mouth.    . metformin (FORTAMET) 1000 MG (OSM) 24 hr tablet Take 2 tablets (2,000 mg total) by mouth daily with breakfast. 180 tablet 2  . Multiple Vitamin (MULTIVITAMIN) tablet Take 1 tablet by mouth daily. AM    . nystatin cream (MYCOSTATIN) Apply 1 application topically 2 (two) times daily. 45 g 0  . olmesartan-hydrochlorothiazide (BENICAR HCT) 20-12.5 MG per tablet Take 1 tablet by mouth daily. AM    . Omega-3 Fatty Acids (FISH OIL) 1200 MG CPDR Take 2 each by mouth.    . Plant Sterols and Stanols 450 MG CAPS Take 2 capsules by mouth 3 (three) times daily with meals. (Patient taking differently: Take 2 capsules by mouth 3 (three) times daily with meals. Cholestoff)    . Probiotic Product (PROBIOTIC PO) Take by mouth. RenewLife     No current facility-administered medications for this visit.     REVIEW OF SYSTEMS:   Constitutional: Denies fevers, chills or abnormal night sweats Eyes: Denies blurriness of vision, double vision or watery eyes Ears, nose, mouth, throat, and face: Denies mucositis or sore throat Respiratory: Denies cough, dyspnea or wheezes Cardiovascular: Denies palpitation, chest discomfort or lower extremity swelling Gastrointestinal:  Denies nausea, heartburn or change in bowel habits Skin: Denies abnormal skin rashes Lymphatics: Denies new lymphadenopathy or  easy bruising Neurological:Denies numbness, tingling or new weaknesses Behavioral/Psych: Mood is stable, no new changes  Breast: Denies any palpable lumps or discharge All other systems were reviewed with the patient and are negative.  PHYSICAL EXAMINATION: ECOG PERFORMANCE STATUS: 1 - Symptomatic but completely ambulatory  Vitals:   04/01/18 1303  BP: 104/69  Pulse: 97  Resp: 18  Temp: 98.9 F (37.2 C)  SpO2: 97%   Filed Weights   04/01/18 1303  Weight: 188 lb 9.6 oz (85.5 kg)    GENERAL:alert, no distress and comfortable SKIN: skin color, texture, turgor are normal, no rashes or significant lesions EYES: normal, conjunctiva are pink and non-injected, sclera clear OROPHARYNX:no exudate, no erythema and lips, buccal mucosa, and tongue normal  NECK: supple, thyroid normal size, non-tender, without nodularity LYMPH:  no palpable lymphadenopathy in the cervical, axillary or inguinal LUNGS: clear to auscultation and percussion with normal breathing effort HEART: regular rate & rhythm and no murmurs and no lower extremity edema ABDOMEN:abdomen soft, non-tender and normal bowel sounds Musculoskeletal:no cyanosis of digits and no clubbing  PSYCH: alert & oriented x 3 with fluent speech NEURO: no focal motor/sensory deficits  LABORATORY DATA:  I have reviewed the data as listed Lab Results  Component Value Date   WBC 10.9 (H) 03/10/2018   HGB 13.4 03/10/2018   HCT 40.2 03/10/2018   MCV 83.6 03/10/2018   PLT 395 03/10/2018   Lab Results  Component Value Date   NA 142 03/10/2018   K 4.2 03/10/2018   CL 101 03/10/2018   CO2 29 03/10/2018    RADIOGRAPHIC STUDIES: I have personally reviewed the radiological reports and agreed with the findings in the report.  ASSESSMENT AND PLAN:  Malignant neoplasm of upper-inner quadrant of left breast in female, estrogen receptor positive ([place]) 03/21/2018:Screening mammogram detected left breast mass 11:30 position 3 x 3 x 4 mm, axilla  negative, biopsy revealed grade 1 IDC with DCIS ER 100%, PR 10%, Ki-67 2%, HER-2 -1+  by IHC, T1 a N0 stage Ia clinical stage  Pathology and radiology counseling:Discussed with the patient, the details of pathology including the type of breast cancer,the clinical staging, the significance of ER, PR and HER-2/neu receptors and the implications for treatment. After reviewing the pathology in detail, we proceeded to discuss the different treatment options between surgery, radiation, chemotherapy, antiestrogen therapies.  Recommendations: 1. Breast conserving surgery followed by 2. Oncotype DX testing if the final tumor size is greater than 1 cm to determine if chemotherapy would be of any benefit followed by 3. Adjuvant radiation therapy followed by 4. Adjuvant antiestrogen therapy  Oncotype counseling: I discussed Oncotype DX test. I explained to the patient that this is a 21 gene panel to evaluate patient tumors DNA to calculate recurrence score. This would help determine whether patient has high risk or intermediate risk or low risk breast cancer. She understands that if her tumor was found to be high risk, she would benefit from systemic chemotherapy. If low risk, no need of chemotherapy. If she was found to be intermediate risk, we would need to evaluate the score as well as other risk factors and determine if an abbreviated chemotherapy may be of benefit.  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  Return to clinic after surgery to discuss final pathology report and then determine if Oncotype DX testing will need to be sent.   All questions were answered. The patient knows to call the clinic with any problems, questions or concerns.   Nicholas Lose, MD 04/01/2018   I, Cloyde Reams Dorshimer, am acting as scribe for Nicholas Lose, MD.  I have reviewed the above documentation for accuracy and completeness, and I agree with the above.

## 2018-04-01 NOTE — Assessment & Plan Note (Signed)
03/21/2018:Screening mammogram detected left breast mass 11:30 position 3 x 3 x 4 mm, axilla negative, biopsy revealed grade 1 IDC with DCIS ER 100%, PR 10%, Ki-67 2%, HER-2 -1+ by IHC, T1 a N0 stage Ia clinical stage  Pathology and radiology counseling:Discussed with the patient, the details of pathology including the type of breast cancer,the clinical staging, the significance of ER, PR and HER-2/neu receptors and the implications for treatment. After reviewing the pathology in detail, we proceeded to discuss the different treatment options between surgery, radiation, chemotherapy, antiestrogen therapies.  Recommendations: 1. Breast conserving surgery followed by 2. Oncotype DX testing if the final tumor size is greater than 1 cm to determine if chemotherapy would be of any benefit followed by 3. Adjuvant radiation therapy followed by 4. Adjuvant antiestrogen therapy  Oncotype counseling: I discussed Oncotype DX test. I explained to the patient that this is a 21 gene panel to evaluate patient tumors DNA to calculate recurrence score. This would help determine whether patient has high risk or intermediate risk or low risk breast cancer. She understands that if her tumor was found to be high risk, she would benefit from systemic chemotherapy. If low risk, no need of chemotherapy. If she was found to be intermediate risk, we would need to evaluate the score as well as other risk factors and determine if an abbreviated chemotherapy may be of benefit.  Return to clinic after surgery to discuss final pathology report and then determine if Oncotype DX testing will need to be sent.

## 2018-04-01 NOTE — Progress Notes (Signed)
REFERRING PROVIDER: Jovita Kussmaul, MD [address], [place] [address]  PRIMARY PROVIDER:  Alycia Rossetti, MD  PRIMARY REASON FOR VISIT:  1. Malignant neoplasm of upper-inner quadrant of left breast in female, estrogen receptor positive ([place])   2. Family history of breast cancer   3. Family history of ovarian cancer   4. Family history of colon cancer   5. Family history of uterine cancer      HISTORY OF PRESENT ILLNESS:   Ms. Kosier, a 58 y.o. female, was seen for a [facility] cancer genetics consultation at the request of Dr. Marlou Starks due to her recent diagnosis of breast cancer as well as a family history of cancer. Ms. Clites presents to clinic today to discuss the possibility of a hereditary predisposition to cancer, genetic testing, and to further clarify her future cancer risks, as well as potential cancer risks for family members.   In 2020, at the age of 24, Ms. Abrams was diagnosed with left breast cancer, ER+, PR+, Her2-. The current treatment plan includes surgery, possible chemotherapy, adjuvant radiation and adjuvant antiestrogen therapy. Ms. Brenton does feel her decision regarding surgery would depend on genetic test results.   CANCER HISTORY:    Malignant neoplasm of upper-inner quadrant of left breast in female, estrogen receptor positive ([place])   03/21/2018 Initial Diagnosis    Screening mammogram detected left breast mass 11:30 position 3 x 3 x 4 mm, axilla negative, biopsy revealed grade 1 IDC with DCIS ER 100%, PR 10%, Ki-67 2%, HER-2 -1+ by IHC, T1 a N0 stage Ia clinical stage    04/01/2018 Cancer Staging    Staging form: Breast, AJCC 8th Edition - Clinical stage from 04/01/2018: Stage IA (cT1a, cN0, cM0, G1, ER+, PR+, HER2-) - Signed by Nicholas Lose, MD on 04/01/2018      HORMONAL RISK FACTORS:  Menarche was at age 45.  First live birth at age 9.  OCP use for approximately 10 years.  Ovaries intact: yes.  Hysterectomy: no.  Menopausal  status: postmenopausal.  HRT use: 0 years. Colonoscopy: yes; had polyps beginning in her 44's. Mammogram within the last year: yes. Number of breast biopsies: 1.  Past Medical History:  Diagnosis Date  . Complication of anesthesia   . Depression   . Diabetes mellitus    TYPE 2  . Family history of breast cancer   . Family history of colon cancer   . Family history of ovarian cancer   . Family history of uterine cancer   . Hearing loss    RIGHT EAR  . Hyperlipidemia   . Hypertension     Past Surgical History:  Procedure Laterality Date  . ankle replacement    . CESAREAN SECTION    . CHOLECYSTECTOMY    . COLONOSCOPY    . FOREIGN BODY REMOVAL EAR Right 09/24/2014   Procedure: REMOVAL FOREIGN BODY EAR EUA;  Surgeon: Beverly Gust, MD;  Location: [facility];  Service: ENT;  Laterality: Right;  DIABETIC-ORAL MEDS, EUA bilaterally  . TOTAL ANKLE REPLACEMENT    . TUBAL LIGATION      Social History   Socioeconomic History  . Marital status: Married    Spouse name: Not on file  . Number of children: 2  . Years of education: Not on file  . Highest education level: Not on file  Occupational History    Employer: [employer]  Social Needs  . Financial resource strain: Not hard at all  .  Food insecurity:    Worry: Never true    Inability: Never true  . Transportation needs:    Medical: Not on file    Non-medical: Not on file  Tobacco Use  . Smoking status: Never Smoker  . Smokeless tobacco: Never Used  Substance and Sexual Activity  . Alcohol use: No  . Drug use: No  . Sexual activity: Yes    Birth control/protection: Surgical  Lifestyle  . Physical activity:    Days per week: 0 days    Minutes per session: 0 min  . Stress: To some extent  Relationships  . Social connections:    Talks on phone: More than three times a week    Gets together: More than three times a week    Attends religious service: More than 4 times per year    Active member of club or  organization: No    Attends meetings of clubs or organizations: Never    Relationship status: Married  Other Topics Concern  . Not on file  Social History Narrative  . Not on file     FAMILY HISTORY:  We obtained a detailed, 4-generation family history.  Significant diagnoses are listed below: Family History  Problem Relation Age of Onset  . Arthritis Mother   . Cancer Mother        SKIN  . Depression Mother   . Hypertension Mother   . Hyperlipidemia Mother   . Uterine cancer Mother        dx 28s  . Arthritis Father   . Cancer Father        SKIN  . Depression Father   . Stroke Father   . Hypertension Father   . Hyperlipidemia Father   . Prostate cancer Father        dx 58s  . Colon cancer Father        dx 40s  . Vision loss Sister   . Hyperlipidemia Sister   . Asthma Brother   . COPD Brother   . Diabetes Brother   . Vision loss Brother   . Hypertension Brother   . Hyperlipidemia Brother   . Arthritis Maternal Grandmother   . Diabetes Maternal Grandmother   . Hyperlipidemia Maternal Grandmother   . Heart disease Maternal Grandmother   . Arthritis Maternal Grandfather   . Diabetes Maternal Grandfather   . Hyperlipidemia Maternal Grandfather   . Heart disease Maternal Grandfather   . Arthritis Paternal Grandmother   . Diabetes Paternal Grandmother   . Hyperlipidemia Paternal Grandmother   . Heart disease Paternal Grandmother   . Arthritis Paternal Grandfather   . Diabetes Paternal Grandfather   . Hyperlipidemia Paternal Grandfather   . Heart disease Paternal Grandfather   . Breast cancer Paternal Aunt        dx 72s  . Ovarian cancer Paternal Aunt        dx 49s  . Colon cancer Paternal Aunt   . Breast cancer Cousin        3 cousins, one dx at 18, one in her 48s, one in her 16s  . Colon cancer Maternal Uncle   . Lung cancer Maternal Uncle   . Breast cancer Maternal Aunt        dx 43s  . Breast cancer Cousin        4 cousins, one dx 29s, one dx 66, one dx  25, one dx 80    Ms. Arcos has a daughter, 34, and a son, 73.  She has a brother who is 54 and a sister who is 57. No cancers for her siblings or nieces.  Ms. Gram's mother was diagnosed with uterine cancer and cervical cancer, unsure of exact age but it was older than age 102. Ms. Vaquerano has 3 maternal aunts, 3 maternal uncles. One of her aunts had breast cancer in her 52s, and had a granddaughter (the patient's first cousin once removed) recently diagnosed with breast cancer at 73. Another aunt had two daughters (the patient's first cousins) with breast cancer, one in her 52s and another at 42. An uncle, who had lung cancer, also had a daughter (patient's first cousin) with breast cancer at 48. Another uncle had colon cancer in his 1s and lung cancer in his 16s. The patient's maternal grandfather died in his 35's due to heart issues, and maternal grandmother died in her 55s.  Ms. Dirosa's father was diagnosed with prostate and colon cancer in his 31s. Ms. Lafountain has 3 paternal aunts. One of her aunts had breast cancer in her 74s, and ovarian cancer in her 40s, died in her 70s. Another aunt had colon cancer and died at 60. This aunt had a daughter (the patient's cousin) who had breast cancer recently at 49. This cousin's daughter had breast cancer in her late 14s and is living in her late 33s. The patient also has a female paternal cousin who had a daughter with breast cancer in her 17s. Her paternal grandparents both passed in their 50s. Ms. Furuta reports that no one in her family wants to have genetic testing.   Ms. Montero is unaware of previous family history of genetic testing for hereditary cancer risks. Patient's maternal ancestors are of [place] descent, and paternal ancestors are of [place] descent. There is no reported Ashkenazi Jewish ancestry. There is no known consanguinity.  GENETIC COUNSELING ASSESSMENT: Analiya Porco is a 58 y.o. female with a personal and family history  which is somewhat suggestive of a Hereditary Cancer Predisposition Syndrome, such as Hereditary Breast and Ovarian Cancer Syndrome. We, therefore, discussed and recommended the following at today's visit.   DISCUSSION: We discussed that about 5-10% of breast cancer cases are hereditary with most cases due to BRCA mutations.  Other genes associated with hereditary breast cancer cases include ATM, CHEK2 and PALB2.  We reviewed the characteristics, features and inheritance patterns of hereditary cancer syndromes. We also discussed genetic testing, including the appropriate family members to test, the process of testing, insurance coverage and turn-around-time for results. We discussed the implications of a negative, positive and/or variant of uncertain significant result. We recommended Ms. Ram pursue genetic testing for the [facility] STAT Breast Cancer Panel + Common Hereditary Cancers Panel.  The STAT Breast cancer panel offered by [facility] includes sequencing and rearrangement analysis for the following 9 genes:  ATM, BRCA1, BRCA2, CDH1, CHEK2, PALB2, PTEN, STK11 and TP53.    The Common Hereditary Cancers Panel offered by [facility] includes sequencing and/or deletion duplication testing of the following 47 genes: APC, ATM, AXIN2, BARD1, BMPR1A, BRCA1, BRCA2, BRIP1, CDH1, CDKN2A (p14ARF), CDKN2A (p16INK4a), CKD4, CHEK2, CTNNA1, DICER1, EPCAM (Deletion/duplication testing only), GREM1 (promoter region deletion/duplication testing only), KIT, MEN1, MLH1, MSH2, MSH3, MSH6, MUTYH, NBN, NF1, NHTL1, PALB2, PDGFRA, PMS2, POLD1, POLE, PTEN, RAD50, RAD51C, RAD51D, SDHB, SDHC, SDHD, SMAD4, SMARCA4. STK11, TP53, TSC1, TSC2, and VHL.  The following genes were evaluated for sequence changes only: SDHA and HOXB13 c.251G>A variant only.  We discussed that if she is found to have a  mutation in one of these genes, it may impact surgical decisions, and alter future medical management recommendations such as increased cancer  screenings and consideration of risk reducing surgeries.  A positive result could also have implications for the patient's family members.  A Negative result would mean we were unable to identify a hereditary component to her cancer, but does not rule out the possibility of a hereditary basis for her cancer.  There could be mutations that are undetectable by current technology, or in genes not yet tested or identified to increase cancer risk.    We discussed the potential to find a Variant of Uncertain Significance or VUS.  These are variants that have not yet been identified as pathogenic or benign, and it is unknown if this variant is associated with increased cancer risk or if this is a normal finding.  Most VUS's are reclassified to benign or likely benign.   It should not be used to make medical management decisions. With time, we suspect the lab will determine the significance of any VUS's identified if any.   Based on Ms. Forney's personal and family history of cancer, she meets NCCN medical criteria for genetic testing. Despite that she meets criteria, she may still have an out of pocket cost. The lab will notify her of an OOP if any.  PLAN: After considering the risks, benefits, and limitations, Ms. Krinsky  provided informed consent to pursue genetic testing and the blood sample was sent to [facility] for analysis of the STAT Breast Cancer Panel + Common Hereditary Cancers Panel. Initial results should be available within approximately 1 weeks' time, at which point they will be disclosed by telephone to Ms. Tolleson, as will any additional recommendations warranted by these results. Ms. Chancellor will receive a summary of her genetic counseling visit and a copy of her results once available. This information will also be available in Epic.  Based on Ms. Musick's family history, we recommended her maternal and paternal relatives have genetic counseling and testing. Ms. Buehler will let us  know if we can be of any assistance in coordinating genetic counseling and/or testing for these family members.  Lastly, we encouraged Ms. Thurow to remain in contact with cancer genetics annually so that we can continuously update the family history and inform her of any changes in cancer genetics and testing that may be of benefit for this family.   Ms.  Piccini's questions were answered to her satisfaction today. Our contact information was provided should additional questions or concerns arise. Thank you for the referral and allowing [place] to share in the care of your patient.   Faith Rogue, MS Genetic Counselor [email or web address] Phone: [phone]   The patient was seen for a total of 40 minutes in face-to-face genetic counseling.

## 2018-04-02 ENCOUNTER — Telehealth: Payer: Self-pay | Admitting: Hematology and Oncology

## 2018-04-02 NOTE — Progress Notes (Signed)
Location of Breast Cancer: Malignant neoplasm of Upper inner quadrant of left breast, ER +  Staging form: Breast, AJCC 8th Edition - Clinical stage from 04/01/2018: Stage IA (cT1a, cN0, cM0, G1, ER+, PR+, HER2-)   biopsy revealed grade 1 IDC with DCIS ER 100%, PR 10%, Ki-67 2%, HER-2 -1+ by IHC, T1 a N0 stage Ia clinical stage  Did patient present with symptoms (if so, please note symptoms) or was this found on screening mammography?:  Mass found on screening mammogram  Diagnostic mammogram 03/14/2018: 4 mm mass in the 11:30 position in the left breast 5cm from the nipple.  Screening mammogram 03/05/2018: left breast mass 11:30 position 3 x 3 x 4 mm, axilla negative.   Histology per Pathology Report: Left breast 03/21/2018  Receptor Status: ER(+ 100%), PR (+ 10%), Her2-neu (-), Ki-67(2%)   Past/Anticipated interventions by surgeon, if any: Dr. Toth 03/2018 - I have discussed with her in detail the different options for treatment and at this point she favors breast conservation. -She is also a good candidate for sentinel node mapping. -I will go ahead and make her referrals to medical and radiation oncology as well as genetics. -I will plan for a left breast radioactive seed localized lumpectomy and sentinel node mapping. -Genetics will cause a delay in surgery then we may consider starting her on an antiestrogen.  Past/Anticipated interventions by medical oncology, if any: Chemotherapy  Dr. Gudena 04/01/2018 Recommendations: 1. Breast conserving surgery followed by 2. Oncotype DX testing if the final tumor size is greater than 1 cm to determine if chemotherapy would be of any benefit followed by 3. Adjuvant radiation therapy followed by 4. Adjuvant antiestrogen therapy -Return to clinic after surgery to discuss final pathology report and then determine if Oncotype DX testing will need to be sent.  Lymphedema issues, if any: No  Pain issues, if any: No  BP (!) 117/50 (BP Location:  Left Arm, Patient Position: Sitting)   Pulse 94   Temp 99.3 F (37.4 C) (Oral)   Resp 20   Ht 5' 4" (1.626 m)   Wt 191 lb (86.6 kg)   LMP 03/06/2011   SpO2 99%   BMI 32.79 kg/m    Wt Readings from Last 3 Encounters:  04/03/18 191 lb (86.6 kg)  04/01/18 188 lb 9.6 oz (85.5 kg)  03/04/18 191 lb (86.6 kg)    SAFETY ISSUES:  Prior radiation? No  Pacemaker/ICD? No  Possible current pregnancy? Tubal ligation  Is the patient on methotrexate? No  Current Complaints / other details:   -Genetics pending      Silva, LaToya M, RN 04/02/2018,3:34 PM   

## 2018-04-02 NOTE — Telephone Encounter (Signed)
No los °

## 2018-04-03 ENCOUNTER — Other Ambulatory Visit: Payer: Self-pay

## 2018-04-03 ENCOUNTER — Ambulatory Visit
Admission: RE | Admit: 2018-04-03 | Discharge: 2018-04-03 | Disposition: A | Discharge status: Home / Self Care | Payer: No Typology Code available for payment source | Source: Ambulatory Visit | Attending: Radiation Oncology | Admitting: Radiation Oncology

## 2018-04-03 ENCOUNTER — Encounter: Payer: Self-pay | Admitting: Radiation Oncology

## 2018-04-03 VITALS — BP 117/50 | HR 94 | Temp 99.3°F | Resp 20 | Ht 64.0 in | Wt 191.0 lb

## 2018-04-03 DIAGNOSIS — C50212 Malignant neoplasm of upper-inner quadrant of left female breast: Secondary | ICD-10-CM | POA: Insufficient documentation

## 2018-04-03 DIAGNOSIS — Z170 Estrogen receptor positive status [ER+]: Secondary | ICD-10-CM | POA: Insufficient documentation

## 2018-04-03 DIAGNOSIS — F329 Major depressive disorder, single episode, unspecified: Secondary | ICD-10-CM | POA: Insufficient documentation

## 2018-04-03 DIAGNOSIS — Z79899 Other long term (current) drug therapy: Secondary | ICD-10-CM | POA: Diagnosis not present

## 2018-04-03 DIAGNOSIS — E119 Type 2 diabetes mellitus without complications: Secondary | ICD-10-CM | POA: Insufficient documentation

## 2018-04-03 DIAGNOSIS — E785 Hyperlipidemia, unspecified: Secondary | ICD-10-CM | POA: Diagnosis not present

## 2018-04-03 DIAGNOSIS — Z800 Family history of malignant neoplasm of digestive organs: Secondary | ICD-10-CM | POA: Insufficient documentation

## 2018-04-03 DIAGNOSIS — Z7984 Long term (current) use of oral hypoglycemic drugs: Secondary | ICD-10-CM | POA: Diagnosis not present

## 2018-04-03 DIAGNOSIS — R69 Illness, unspecified: Secondary | ICD-10-CM | POA: Diagnosis not present

## 2018-04-03 DIAGNOSIS — I10 Essential (primary) hypertension: Secondary | ICD-10-CM | POA: Diagnosis not present

## 2018-04-03 DIAGNOSIS — Z7982 Long term (current) use of aspirin: Secondary | ICD-10-CM | POA: Insufficient documentation

## 2018-04-03 DIAGNOSIS — Z8041 Family history of malignant neoplasm of ovary: Secondary | ICD-10-CM | POA: Insufficient documentation

## 2018-04-03 DIAGNOSIS — Z803 Family history of malignant neoplasm of breast: Secondary | ICD-10-CM | POA: Insufficient documentation

## 2018-04-03 NOTE — Progress Notes (Signed)
Radiation Oncology         [phone] ________________________________  Name: Sheena Ruiz        MRN: [id number]  Date of Service: 04/03/2018 DOB: [birth year]  KG:URKYHC, Modena Nunnery, MD  Jovita Kussmaul, MD     REFERRING PHYSICIAN: Autumn Messing III, MD   DIAGNOSIS: The encounter diagnosis was Malignant neoplasm of upper-inner quadrant of left breast in female, estrogen receptor positive ([place]).   HISTORY OF PRESENT ILLNESS: Sheena Ruiz is a 58 y.o. female seen in the multidisciplinary breast clinic for a new diagnosis of left breast cancer. The patient was noted to have a screening detected mass in the left breast. She had diagnostic imaging on 03/14/2018 that revealed a 4 mm lesion at the 11:30 position. No adenopathy was seen by ultrasound in the axilla on the left. A biopsy on 03/21/2018 revealed a grade 1 invasive ductal carcinoma, and her tumor was ER/PR positive, HER2 negative and a Ki 67 was 2%. She has met with Dr. Marlou Starks. She is a candidate for breast conservation, but is awaiting results from genetic testing to determine her surgical decision making. She comes today to discuss the role of radiotherapy in the adjuvant setting.    PREVIOUS RADIATION THERAPY: No   PAST MEDICAL HISTORY:  Past Medical History:  Diagnosis Date  . Complication of anesthesia   . Depression   . Diabetes mellitus    TYPE 2  . Family history of breast cancer   . Family history of colon cancer   . Family history of ovarian cancer   . Family history of uterine cancer   . Hearing loss    RIGHT EAR  . Hyperlipidemia   . Hypertension        PAST SURGICAL HISTORY: Past Surgical History:  Procedure Laterality Date  . ankle replacement    . CESAREAN SECTION    . CHOLECYSTECTOMY    . COLONOSCOPY    . FOREIGN BODY REMOVAL EAR Right 09/24/2014   Procedure: REMOVAL FOREIGN BODY EAR EUA;  Surgeon: Beverly Gust, MD;  Location: [facility];  Service: ENT;  Laterality: Right;   DIABETIC-ORAL MEDS, EUA bilaterally  . TOTAL ANKLE REPLACEMENT    . TUBAL LIGATION       FAMILY HISTORY:  Family History  Problem Relation Age of Onset  . Arthritis Mother   . Cancer Mother        SKIN  . Depression Mother   . Hypertension Mother   . Hyperlipidemia Mother   . Uterine cancer Mother        dx 10s  . Arthritis Father   . Cancer Father        SKIN  . Depression Father   . Stroke Father   . Hypertension Father   . Hyperlipidemia Father   . Prostate cancer Father        dx 70s  . Colon cancer Father        dx 76s  . Vision loss Sister   . Hyperlipidemia Sister   . Asthma Brother   . COPD Brother   . Diabetes Brother   . Vision loss Brother   . Hypertension Brother   . Hyperlipidemia Brother   . Arthritis Maternal Grandmother   . Diabetes Maternal Grandmother   . Hyperlipidemia Maternal Grandmother   . Heart disease Maternal Grandmother   . Arthritis Maternal Grandfather   . Diabetes Maternal Grandfather   . Hyperlipidemia Maternal Grandfather   . Heart disease  Maternal Grandfather   . Arthritis Paternal Grandmother   . Diabetes Paternal Grandmother   . Hyperlipidemia Paternal Grandmother   . Heart disease Paternal Grandmother   . Arthritis Paternal Grandfather   . Diabetes Paternal Grandfather   . Hyperlipidemia Paternal Grandfather   . Heart disease Paternal Grandfather   . Breast cancer Paternal Aunt        dx 39s  . Ovarian cancer Paternal Aunt        dx 61s  . Colon cancer Paternal Aunt   . Breast cancer Cousin        3 cousins, one dx at 10, one in her 82s, one in her 28s  . Colon cancer Maternal Uncle   . Lung cancer Maternal Uncle   . Breast cancer Maternal Aunt        dx 87s  . Breast cancer Cousin        4 cousins, one dx 85s, one dx 59, one dx 64, one dx 68     SOCIAL HISTORY:  reports that she has never smoked. She has never used smokeless tobacco. She reports that she does not drink alcohol or use drugs. The patient is married  and lives in [place]. 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 Her husband is a family Engineer, petroleum in [place].   ALLERGIES: Tricor [fenofibrate] and Penicillins   MEDICATIONS:  Current Outpatient Medications  Medication Sig Dispense Refill  . aspirin EC 81 MG tablet Take 81 mg by mouth daily.    . Blood Glucose Monitoring Suppl (BLOOD GLUCOSE SYSTEM PAK) KIT Please dispense based on patient and insurance preference. Use as directed to monitor FSBS 2x daily. Dx: E11.9. 1 each 1  . Cinnamon 500 MG capsule Take 1,000 mg by mouth daily.    . EFFEXOR XR 150 MG 24 hr capsule TAKE 1 CAPSULE BY MOUTH DAILY 30 capsule 0  . ELDERBERRY PO Take 2 each by mouth.    . Glucose Blood (BLOOD GLUCOSE TEST STRIPS) STRP Please dispense based on patient and insurance preference. Use as directed to monitor FSBS 2x daily. Dx: E11.9. 100 each 1  . glucose blood (ONE TOUCH ULTRA TEST) test strip USE AS DIRECTED TWICE A DAY 100 each 3  . Lancet Devices MISC Please dispense based on patient and insurance preference. Use as directed to monitor FSBS 2x daily. Dx: E11.9. 1 each 1  . Lancets MISC Please dispense based on patient and insurance preference. Use as directed to monitor FSBS 2x daily. Dx: E11.9. 100 each 1  . Lysine 1000 MG TABS Take by mouth.    . metformin (FORTAMET) 1000 MG (OSM) 24 hr tablet Take 2 tablets (2,000 mg total) by mouth daily with breakfast. 180 tablet 2  . Multiple Vitamin (MULTIVITAMIN) tablet Take 1 tablet by mouth daily. AM    . olmesartan-hydrochlorothiazide (BENICAR HCT) 20-12.5 MG per tablet Take 1 tablet by mouth daily. AM    . Omega-3 Fatty Acids (FISH OIL) 1200 MG CPDR Take 2 each by mouth.    . Plant Sterols and Stanols 450 MG CAPS Take 2 capsules by mouth 3 (three) times daily with meals. (Patient taking differently: Take 2 capsules by mouth 3 (three) times daily with meals. Cholestoff)    . Probiotic Product (PROBIOTIC PO) Take by  mouth. RenewLife    . ALPRAZolam (XANAX) 0.5 MG tablet Take 1 tablet (0.5 mg total) by mouth at bedtime as needed for anxiety. (Patient not  taking: Reported on 03/04/2018) 30 tablet 2  . Dulaglutide (TRULICITY) 8.41 LK/4.4WN SOPN Inject 0.75 mg into the skin every 7 (seven) days. (Patient not taking: Reported on 04/03/2018) 4 pen 11  . metFORMIN (GLUCOPHAGE-XR) 500 MG 24 hr tablet 400 mg.     . nystatin cream (MYCOSTATIN) Apply 1 application topically 2 (two) times daily. (Patient not taking: Reported on 04/03/2018) 45 g 0   No current facility-administered medications for this encounter.      REVIEW OF SYSTEMS: On review of systems, the patient reports she is doing well and has had laryngitis. She has had some low grade temperatures. No other complaints are noted.  PHYSICAL EXAM:  Wt Readings from Last 3 Encounters:  04/03/18 191 lb (86.6 kg)  04/01/18 188 lb 9.6 oz (85.5 kg)  03/04/18 191 lb (86.6 kg)   Temp Readings from Last 3 Encounters:  04/03/18 99.3 F (37.4 C) (Oral)  04/01/18 98.9 F (37.2 C) (Oral)  03/04/18 98.6 F (37 C) (Oral)   BP Readings from Last 3 Encounters:  04/03/18 (!) 117/50  04/01/18 104/69  03/04/18 126/72   Pulse Readings from Last 3 Encounters:  04/03/18 94  04/01/18 97  03/04/18 66     In general this is a well appearing caucasian female in no acute distress. She is alert and oriented x4 and appropriate throughout the examination. HEENT reveals that the patient is normocephalic, atraumatic. EOMs are intact. Skin is intact without any evidence of gross lesions. Cardiopulmonary assessment is negative for acute distress and she exhibits normal effort. Breast exam is deferred.   ECOG = 0  0 - Asymptomatic (Fully active, able to carry on all predisease activities without restriction)  1 - Symptomatic but completely ambulatory (Restricted in physically strenuous activity but ambulatory and able to carry out work of a light or sedentary nature. For  example, light housework, office work)  2 - Symptomatic, <50% in bed during the day (Ambulatory and capable of all self care but unable to carry out any work activities. Up and about more than 50% of waking hours)  3 - Symptomatic, >50% in bed, but not bedbound (Capable of only limited self-care, confined to bed or chair 50% or more of waking hours)  4 - Bedbound (Completely disabled. Cannot carry on any self-care. Totally confined to bed or chair)  5 - Death   Eustace Pen MM, Creech RH, Tormey DC, et al. [phone]). "Toxicity and response criteria of the [facility]". [place] Oncol. 5 (6): 649-55    LABORATORY DATA:  Lab Results  Component Value Date   WBC 10.9 (H) 03/10/2018   HGB 13.4 03/10/2018   HCT 40.2 03/10/2018   MCV 83.6 03/10/2018   PLT 395 03/10/2018   Lab Results  Component Value Date   NA 142 03/10/2018   K 4.2 03/10/2018   CL 101 03/10/2018   CO2 29 03/10/2018   Lab Results  Component Value Date   ALT 13 03/10/2018   AST 12 03/10/2018   ALKPHOS 75 12/14/2013   BILITOT 0.5 03/10/2018      RADIOGRAPHY: US Breast Ltd Uni Left Inc Axilla  Result Date: 03/14/2018 CLINICAL DATA:  Patient presents for additional views of the left breast as follow-up to recent screening exam to evaluate a possible mass. EXAM: DIGITAL DIAGNOSTIC left MAMMOGRAM WITH TOMO ULTRASOUND left BREAST COMPARISON:  Previous exam(s). ACR Breast Density Category b: There are scattered areas of fibroglandular density. FINDINGS: Spot compression tomographic images demonstrate an  irregular slightly spiculated bordered 4-5 mm mass over the slightly inner upper left breast. Targeted ultrasound is performed, showing a small hypoechoic mass with irregular shape and borders and distal shadowing at the 11:30 position of the left breast 5 cm from the nipple corresponding to the mammographic finding. This measures 3 x 3 x 4 mm. Ultrasound of the left axilla is normal. IMPRESSION: Suspicious 4  mm mass over the 11:30 position of the left breast 5 cm from the nipple. RECOMMENDATION: Recommend ultrasound-guided core needle biopsy of this suspicious mass. I have discussed the findings and recommendations with the patient. Results were also provided in writing at the conclusion of the visit. If applicable, a reminder letter will be sent to the patient regarding the next appointment. BI-RADS CATEGORY  4: Suspicious. Biopsy will be scheduled here at the [facility] prior to patient's departure. Electronically Signed   By: Marin Olp M.D.   On: 03/14/2018 12:31   Mm Diag Breast Tomo Uni Left  Result Date: 03/14/2018 CLINICAL DATA:  Patient presents for additional views of the left breast as follow-up to recent screening exam to evaluate a possible mass. EXAM: DIGITAL DIAGNOSTIC left MAMMOGRAM WITH TOMO ULTRASOUND left BREAST COMPARISON:  Previous exam(s). ACR Breast Density Category b: There are scattered areas of fibroglandular density. FINDINGS: Spot compression tomographic images demonstrate an irregular slightly spiculated bordered 4-5 mm mass over the slightly inner upper left breast. Targeted ultrasound is performed, showing a small hypoechoic mass with irregular shape and borders and distal shadowing at the 11:30 position of the left breast 5 cm from the nipple corresponding to the mammographic finding. This measures 3 x 3 x 4 mm. Ultrasound of the left axilla is normal. IMPRESSION: Suspicious 4 mm mass over the 11:30 position of the left breast 5 cm from the nipple. RECOMMENDATION: Recommend ultrasound-guided core needle biopsy of this suspicious mass. I have discussed the findings and recommendations with the patient. Results were also provided in writing at the conclusion of the visit. If applicable, a reminder letter will be sent to the patient regarding the next appointment. BI-RADS CATEGORY  4: Suspicious. Biopsy will be scheduled here at the [facility] prior  to patient's departure. Electronically Signed   By: Marin Olp M.D.   On: 03/14/2018 12:31   Mm 3d Screen Breast Bilateral  Result Date: 03/06/2018 CLINICAL DATA:  Screening. EXAM: DIGITAL SCREENING BILATERAL MAMMOGRAM WITH TOMO AND CAD COMPARISON:  Previous exam(s). ACR Breast Density Category b: There are scattered areas of fibroglandular density. FINDINGS: In the left breast, a possible mass warrants further evaluation. In the right breast, no findings suspicious for malignancy. Images were processed with CAD. IMPRESSION: Further evaluation is suggested for possible mass in the left breast. RECOMMENDATION: Diagnostic mammogram and possibly ultrasound of the left breast. (Code:[id number]) The patient will be contacted regarding the findings, and additional imaging will be scheduled. BI-RADS CATEGORY  0: Incomplete. Need additional imaging evaluation and/or prior mammograms for comparison. Electronically Signed   By: Abelardo Diesel M.D.   On: 03/06/2018 08:25   Mm Clip Placement Left  Result Date: 03/21/2018 CLINICAL DATA:  Ultrasound-guided core needle biopsy was performed of a 3-4 mm irregular mass in the 11:30 position of the left breast. EXAM: DIAGNOSTIC LEFT MAMMOGRAM POST ULTRASOUND BIOPSY COMPARISON:  Previous exam(s). FINDINGS: Mammographic images were obtained following ultrasound guided biopsy of a left breast mass at 11:30 position. A ribbon shaped biopsy clip is satisfactorily positioned within the mass. IMPRESSION: Satisfactory  position of ribbon shaped biopsy clip. Final Assessment: Post Procedure Mammograms for Marker Placement Electronically Signed   By: Curlene Dolphin M.D.   On: 03/21/2018 15:21   [place] Lt Breast Bx W Loc Dev 1st Lesion Img Bx Spec US Guide  Addendum Date: 03/25/2018   ADDENDUM REPORT: 03/25/2018 10:04 ADDENDUM: Pathology revealed GRADE I INVASIVE AND IN SITU MAMMARY CARCINOMA of the Left breast, 11:30 o'clock, 5 cm fn. The malignant cells are positive for e-cadherin,  supporting a ductal phenotype. This was found to be concordant by Dr. Curlene Dolphin. Pathology results were discussed with the patient by telephone. The patient reported doing well after the biopsy with tenderness at the site. Post biopsy instructions and care were reviewed and questions were answered. The patient was encouraged to call [facility] for any additional concerns. Surgical consultation has been arranged with Dr. Autumn Messing, per patient request at [facility] on March 25, 2018. Pathology results reported by Terie Purser, RN on 03/25/2018. Electronically Signed   By: Curlene Dolphin M.D.   On: 03/25/2018 10:04   Result Date: 03/25/2018 CLINICAL DATA:  Ultrasound-guided core needle biopsy was recommended of a 3-4 mm irregular mass in the 11:30 position of the left breast. EXAM: ULTRASOUND GUIDED LEFT BREAST CORE NEEDLE BIOPSY COMPARISON:  Previous exam(s). FINDINGS: I met with the patient and we discussed the procedure of ultrasound-guided biopsy, including benefits and alternatives. We discussed the high likelihood of a successful procedure. We discussed the risks of the procedure, including infection, bleeding, tissue injury, clip migration, and inadequate sampling. Informed written consent was given. The usual time-out protocol was performed immediately prior to the procedure. Lesion quadrant: Upper inner quadrant Using sterile technique and 1% Lidocaine as local anesthetic, under direct ultrasound visualization, a 14 gauge spring-loaded device was used to perform biopsy of an irregular small mass at 11:30 position using a medial approach. At the conclusion of the procedure a ribbon tissue marker clip was deployed into the biopsy cavity. Follow up 2 view mammogram was performed and dictated separately. IMPRESSION: Ultrasound guided biopsy of the left breast. No apparent complications. Electronically Signed: By: Curlene Dolphin M.D. On: 03/21/2018 15:22        IMPRESSION/PLAN 1. 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 Dr. Lisbeth Renshaw discusses the pathology findings and reviews the nature of invasive breast disease. The consensus from the breast conference includes breast conservation with lumpectomy with sentinel node biopsy. Depending on the size of the final tumor measurements rendered by pathology, the tumor may be tested for Oncotype Dx score to determine a role for systemic therapy. Provided that chemotherapy is not indicated, the patient's course would then be followed by external radiotherapy to the breast followed by antiestrogen therapy. We discussed the risks, benefits, short, and long term effects of radiotherapy, and the patient is interested in proceeding. Dr. Lisbeth Renshaw discusses the delivery and logistics of radiotherapy and anticipates a course of 4 or 6 1/2 weeks of radiotherapy. We will see her back about 2 weeks after surgery to discuss the simulation process and anticipate we starting radiotherapy about 4-6 weeks after surgery.  2. Possible genetic predisposition to malignancy. The patient is a candidate for genetic testing given her personal and family history. She is awaiting the results of testing. If she chose mastectomy for her cancer based on this information or out of preference, we would not anticipate radiotherapy in a postmastectomy setting. Her final pathology of  course would definitively answer this.   In a visit lasting 45 minutes, greater than 50% of the time was spent face to face discussing her case, and coordinating the patient's care.  The above documentation reflects my direct findings during this shared patient visit. Please see the separate note by Dr. Lisbeth Renshaw on this date for the remainder of the patient's plan of care.    Carola Rhine, PAC

## 2018-04-04 ENCOUNTER — Encounter: Payer: Self-pay | Admitting: General Practice

## 2018-04-04 DIAGNOSIS — I10 Essential (primary) hypertension: Secondary | ICD-10-CM | POA: Diagnosis not present

## 2018-04-04 DIAGNOSIS — C50212 Malignant neoplasm of upper-inner quadrant of left female breast: Secondary | ICD-10-CM | POA: Diagnosis not present

## 2018-04-04 DIAGNOSIS — Z170 Estrogen receptor positive status [ER+]: Secondary | ICD-10-CM | POA: Diagnosis not present

## 2018-04-04 DIAGNOSIS — E8881 Metabolic syndrome: Secondary | ICD-10-CM | POA: Diagnosis not present

## 2018-04-04 DIAGNOSIS — E669 Obesity, unspecified: Secondary | ICD-10-CM | POA: Diagnosis not present

## 2018-04-04 DIAGNOSIS — E119 Type 2 diabetes mellitus without complications: Secondary | ICD-10-CM | POA: Diagnosis not present

## 2018-04-04 NOTE — Progress Notes (Signed)
[facility] Psychosocial Distress Screening Clinical Social Work  Clinical Social Work was referred by distress screening protocol.  The patient scored a 10 on the Psychosocial Distress Thermometer which indicates severe distress. Clinical Social Worker contacted patient by phone to assess for distress and other psychosocial needs. Unable to reach patient by phone, left VM w information about [facility], contact information and encouragement to call as needed for support/resources.    ONCBCN DISTRESS SCREENING 04/03/2018  Screening Type Initial Screening  Distress experienced in past week (1-10) 10  Practical problem type Insurance;Work/school  Emotional problem type Adjusting to illness  Spiritual/Religous concerns type Facing my mortality  Information Concerns Type Lack of info about diagnosis;Lack of info about treatment;Lack of info about complementary therapy choices  Physical Problem type Sleep/insomnia;Mouth sores/swallowing;Tingling hands/feet;Skin dry/itchy  Other Reached by phone or email [phone] [email or web address]    Clinical Social Worker follow up needed: Yes.  Await call from patient and/or schedule visit when patient is at [facility] after treatment plan has been arranged.    If yes, follow up plan:  See above.   Beverely Pace, [place], LCSW Clinical Social Worker Phone:  [phone]

## 2018-04-09 DIAGNOSIS — N6322 Unspecified lump in the left breast, upper inner quadrant: Secondary | ICD-10-CM | POA: Diagnosis not present

## 2018-04-09 DIAGNOSIS — N6312 Unspecified lump in the right breast, upper inner quadrant: Secondary | ICD-10-CM | POA: Diagnosis not present

## 2018-04-09 DIAGNOSIS — Z1231 Encounter for screening mammogram for malignant neoplasm of breast: Secondary | ICD-10-CM | POA: Diagnosis not present

## 2018-04-10 DIAGNOSIS — N6322 Unspecified lump in the left breast, upper inner quadrant: Secondary | ICD-10-CM | POA: Diagnosis not present

## 2018-04-10 DIAGNOSIS — N6312 Unspecified lump in the right breast, upper inner quadrant: Secondary | ICD-10-CM | POA: Diagnosis not present

## 2018-04-11 DIAGNOSIS — N63 Unspecified lump in unspecified breast: Secondary | ICD-10-CM | POA: Diagnosis not present

## 2018-04-11 DIAGNOSIS — C50412 Malignant neoplasm of upper-outer quadrant of left female breast: Secondary | ICD-10-CM | POA: Diagnosis not present

## 2018-04-11 DIAGNOSIS — Z170 Estrogen receptor positive status [ER+]: Secondary | ICD-10-CM | POA: Diagnosis not present

## 2018-04-11 DIAGNOSIS — Z01818 Encounter for other preprocedural examination: Secondary | ICD-10-CM | POA: Diagnosis not present

## 2018-04-14 ENCOUNTER — Ambulatory Visit: Payer: Self-pay | Admitting: Licensed Clinical Social Worker

## 2018-04-14 ENCOUNTER — Encounter: Payer: Self-pay | Admitting: Licensed Clinical Social Worker

## 2018-04-14 ENCOUNTER — Telehealth: Payer: Self-pay | Admitting: Licensed Clinical Social Worker

## 2018-04-14 DIAGNOSIS — Z1379 Encounter for other screening for genetic and chromosomal anomalies: Secondary | ICD-10-CM | POA: Insufficient documentation

## 2018-04-14 NOTE — Progress Notes (Signed)
HPI:  Sheena Ruiz was previously seen in the [facility] on 04/01/2018 due to her recent diagnosis of breast cancer and family history of cancer and concerns regarding a hereditary predisposition to cancer. Please refer to our prior cancer genetics clinic note for more information regarding Sheena Ruiz's medical, social and family histories, and our assessment and recommendations, at the time. Sheena Ruiz's recent genetic test results were disclosed to her, as well as recommendations warranted by these results. These results and recommendations are discussed in more detail below.  CANCER HISTORY:    Malignant neoplasm of upper-inner quadrant of left breast in female, estrogen receptor positive ([place])   03/21/2018 Initial Diagnosis    Screening mammogram detected left breast mass 11:30 position 3 x 3 x 4 mm, axilla negative, biopsy revealed grade 1 IDC with DCIS ER 100%, PR 10%, Ki-67 2%, HER-2 -1+ by IHC, T1 a N0 stage Ia clinical stage    04/01/2018 Cancer Staging    Staging form: Breast, AJCC 8th Edition - Clinical stage from 04/01/2018: Stage IA (cT1a, cN0, cM0, G1, ER+, PR+, HER2-) - Signed by Nicholas Lose, MD on 04/01/2018      FAMILY HISTORY:  We obtained a detailed, 4-generation family history.  Significant diagnoses are listed below: Family History  Problem Relation Age of Onset  . Arthritis Mother   . Cancer Mother        SKIN  . Depression Mother   . Hypertension Mother   . Hyperlipidemia Mother   . Uterine cancer Mother        dx 72s  . Arthritis Father   . Cancer Father        SKIN  . Depression Father   . Stroke Father   . Hypertension Father   . Hyperlipidemia Father   . Prostate cancer Father        dx 64s  . Colon cancer Father        dx 8s  . Vision loss Sister   . Hyperlipidemia Sister   . Asthma Brother   . COPD Brother   . Diabetes Brother   . Vision loss Brother   . Hypertension Brother   . Hyperlipidemia Brother   . Arthritis  Maternal Grandmother   . Diabetes Maternal Grandmother   . Hyperlipidemia Maternal Grandmother   . Heart disease Maternal Grandmother   . Arthritis Maternal Grandfather   . Diabetes Maternal Grandfather   . Hyperlipidemia Maternal Grandfather   . Heart disease Maternal Grandfather   . Arthritis Paternal Grandmother   . Diabetes Paternal Grandmother   . Hyperlipidemia Paternal Grandmother   . Heart disease Paternal Grandmother   . Arthritis Paternal Grandfather   . Diabetes Paternal Grandfather   . Hyperlipidemia Paternal Grandfather   . Heart disease Paternal Grandfather   . Breast cancer Paternal Aunt        dx 32s  . Ovarian cancer Paternal Aunt        dx 46s  . Colon cancer Paternal Aunt   . Breast cancer Cousin        3 cousins, one dx at 9, one in her 76s, one in her 36s  . Colon cancer Maternal Uncle   . Lung cancer Maternal Uncle   . Breast cancer Maternal Aunt        dx 20s  . Breast cancer Cousin        4 cousins, one dx 43s, one dx 45, one dx 54, one dx 76  Sheena Ruiz has a daughter, 47, and a son, 72. She has a brother who is 54 and a sister who is 51. No cancers for her siblings or nieces.  Sheena Ruiz's mother was diagnosed with uterine cancer and cervical cancer, unsure of exact age but it was older than age 49. Sheena Ruiz has 3 maternal aunts, 3 maternal uncles. One of her aunts had breast cancer in her 32s, and had a granddaughter (the patient's first cousin once removed) recently diagnosed with breast cancer at 60. Another aunt had two daughters (the patient's first cousins) with breast cancer, one in her 54s and another at 83. An uncle, who had lung cancer, also had a daughter (patient's first cousin) with breast cancer at 57. Another uncle had colon cancer in his 26s and lung cancer in his 71s. The patient's maternal grandfather died in his 40's due to heart issues, and maternal grandmother died in her 87s.  Sheena Ruiz's father was diagnosed with prostate  and colon cancer in his 37s. Sheena Ruiz has 3 paternal aunts. One of her aunts had breast cancer in her 70s, and ovarian cancer in her 69s, died in her 52s. Another aunt had colon cancer and died at 48. This aunt had a daughter (the patient's cousin) who had breast cancer recently at 65. This cousin's daughter had breast cancer in her late 71s and is living in her late 50s. The patient also has a female paternal cousin who had a daughter with breast cancer in her 110s. Her paternal grandparents both passed in their 43s. Sheena Ruiz reports that no one in her family wants to have genetic testing.   Sheena Ruiz is unaware of previous family history of genetic testing for hereditary cancer risks. Patient's maternal ancestors are of [place] descent, and paternal ancestors are of [place] descent. There is no reported Ashkenazi Jewish ancestry. There is no known consanguinity.  GENETIC TEST RESULTS: Genetic testing performed through [facility]'s Breast Cancer STAT panel + Common Hereditary Cancers Panel reported out on 04/11/2018 showed no pathogenic mutations.   The STAT Breast cancer panel offered by [facility] includes sequencing and rearrangement analysis for the following 9 genes:  ATM, BRCA1, BRCA2, CDH1, CHEK2, PALB2, PTEN, STK11 and TP53.    The Common Hereditary Cancers Panel offered by [facility] includes sequencing and/or deletion duplication testing of the following 47 genes: APC, ATM, AXIN2, BARD1, BMPR1A, BRCA1, BRCA2, BRIP1, CDH1, CDKN2A (p14ARF), CDKN2A (p16INK4a), CKD4, CHEK2, CTNNA1, DICER1, EPCAM (Deletion/duplication testing only), GREM1 (promoter region deletion/duplication testing only), KIT, MEN1, MLH1, MSH2, MSH3, MSH6, MUTYH, NBN, NF1, NHTL1, PALB2, PDGFRA, PMS2, POLD1, POLE, PTEN, RAD50, RAD51C, RAD51D, SDHB, SDHC, SDHD, SMAD4, SMARCA4. STK11, TP53, TSC1, TSC2, and VHL.  The following genes were evaluated for sequence changes only: SDHA and HOXB13 c.251G>A variant only..  The test report will be  scanned into EPIC and will be located under the Molecular Pathology section of the Results Review tab. A portion of the result report is included below for reference.     We discussed with Sheena Ruiz that because current genetic testing is not perfect, it is possible there may be a gene mutation in one of these genes that current testing cannot detect, but that chance is small.  We also discussed, that there could be another gene that has not yet been discovered, or that we have not yet tested, that is responsible for the cancer diagnoses in the family. It is also possible there is a hereditary cause for the cancer in the  family that Sheena Ruiz did not inherit and therefore was not identified in her testing.  Therefore, it is important to remain in touch with cancer genetics in the future so that we can continue to offer Sheena Ruiz the most up to date genetic testing.   ADDITIONAL GENETIC TESTING: We discussed with Sheena Ruiz that her genetic testing was fairly extensive.  If there are are genes identified to increase cancer risk that can be analyzed in the future, we would be happy to discuss and coordinate this testing at that time.    CANCER SCREENING RECOMMENDATIONS: Sheena Ruiz's test result is considered negative (normal).  This means that we have not identified a hereditary cause for her personal and family history of cancer at this time.   While reassuring, this does not definitively rule out a hereditary predisposition to cancer. It is still possible that there could be genetic mutations that are undetectable by current technology, or genetic mutations in genes that have not been tested or identified to increase cancer risk.  Therefore, it is recommended she continue to follow the cancer management and screening guidelines provided by her oncology and primary healthcare provider. An individual's cancer risk is not determined by genetic test results alone.  Overall cancer risk assessment  includes additional factors such as personal medical history, family history, etc.  These should be used to make a personalized plan for cancer prevention and surveillance.    RECOMMENDATIONS FOR FAMILY MEMBERS:  Relatives in this family might be at some increased risk of developing cancer, over the general population risk, simply due to the family history of cancer.  We recommended women in this family have a yearly mammogram beginning at age 20, or 21 years younger than the earliest onset of cancer, an annual clinical breast exam, and perform monthly breast self-exams. Women in this family should also have a gynecological exam as recommended by their primary provider. All family members should have a colonoscopy as directed by their doctors.  All family members should inform their physicians about the family history of cancer so their doctors can make the most appropriate screening recommendations for them.   It is also possible there is a hereditary cause for the cancer in Ms. Fiola's family that she did not inherit and therefore was not identified in her.  We recommended her relatives on both sides of the family, especially those with cancer diagnoses at young ages, have genetic counseling and testing. Ms. Tunnell will let us know if we can be of any assistance in coordinating genetic counseling and/or testing for these family members.   FOLLOW-UP: Lastly, we discussed with Ms. Calix that cancer genetics is a rapidly advancing field and it is possible that new genetic tests will be appropriate for her and/or her family members in the future. We encouraged her to remain in contact with cancer genetics on an annual basis so we can update her personal and family histories and let her know of advances in cancer genetics that may benefit this family.   Our contact number was provided. Ms. Dowdell's questions were answered to her satisfaction, and she knows she is welcome to call us at anytime with  additional questions or concerns.  Faith Rogue, MS Genetic Counselor [email or web address] Phone: [phone]

## 2018-04-14 NOTE — Telephone Encounter (Signed)
Revealed negative genetic testing. We discussed that we do not know why she has breast cancer or why there is cancer in the family. It could be due to a different gene that we are not testing, or something our current technology cannot pick up.  It will be important for her to keep in contact with genetics to learn if additional testing may be needed in the future. Family members on both sides could benefit from genetic counseling and testing, particularly those with a cancer diagnosis at young age. She let me know that she will be receiving her care at [facility] her results will be sent to her care team there.

## 2018-04-18 ENCOUNTER — Telehealth: Payer: Self-pay | Admitting: *Deleted

## 2018-04-18 NOTE — Telephone Encounter (Signed)
Received notification pt going to [facility] for tx. Physician team notified.

## 2018-04-22 DIAGNOSIS — K640 First degree hemorrhoids: Secondary | ICD-10-CM | POA: Diagnosis not present

## 2018-04-22 DIAGNOSIS — Z1211 Encounter for screening for malignant neoplasm of colon: Secondary | ICD-10-CM | POA: Diagnosis not present

## 2018-04-22 DIAGNOSIS — Z86010 Personal history of colonic polyps: Secondary | ICD-10-CM | POA: Diagnosis not present

## 2018-04-22 DIAGNOSIS — I10 Essential (primary) hypertension: Secondary | ICD-10-CM | POA: Diagnosis not present

## 2018-04-22 DIAGNOSIS — E119 Type 2 diabetes mellitus without complications: Secondary | ICD-10-CM | POA: Diagnosis not present

## 2018-04-22 DIAGNOSIS — D122 Benign neoplasm of ascending colon: Secondary | ICD-10-CM | POA: Diagnosis not present

## 2018-04-22 DIAGNOSIS — K635 Polyp of colon: Secondary | ICD-10-CM | POA: Diagnosis not present

## 2018-04-23 LAB — HM COLONOSCOPY

## 2018-04-28 ENCOUNTER — Other Ambulatory Visit: Payer: Self-pay | Admitting: Family Medicine

## 2018-04-28 NOTE — Telephone Encounter (Signed)
Ok to refill??  Last office visit 03/04/2018.  Last refill 04/23/2017, #2 refills.

## 2018-04-29 DIAGNOSIS — C50912 Malignant neoplasm of unspecified site of left female breast: Secondary | ICD-10-CM | POA: Diagnosis not present

## 2018-04-29 DIAGNOSIS — C50212 Malignant neoplasm of upper-inner quadrant of left female breast: Secondary | ICD-10-CM | POA: Diagnosis not present

## 2018-04-29 DIAGNOSIS — Z170 Estrogen receptor positive status [ER+]: Secondary | ICD-10-CM | POA: Diagnosis not present

## 2018-04-30 DIAGNOSIS — Z170 Estrogen receptor positive status [ER+]: Secondary | ICD-10-CM | POA: Diagnosis not present

## 2018-04-30 DIAGNOSIS — C50912 Malignant neoplasm of unspecified site of left female breast: Secondary | ICD-10-CM | POA: Diagnosis not present

## 2018-05-08 DIAGNOSIS — Z170 Estrogen receptor positive status [ER+]: Secondary | ICD-10-CM | POA: Diagnosis not present

## 2018-05-08 DIAGNOSIS — C50412 Malignant neoplasm of upper-outer quadrant of left female breast: Secondary | ICD-10-CM | POA: Diagnosis not present

## 2018-05-12 ENCOUNTER — Encounter: Payer: Self-pay | Admitting: *Deleted

## 2018-05-12 DIAGNOSIS — C50412 Malignant neoplasm of upper-outer quadrant of left female breast: Secondary | ICD-10-CM | POA: Diagnosis not present

## 2018-05-12 DIAGNOSIS — Z170 Estrogen receptor positive status [ER+]: Secondary | ICD-10-CM | POA: Diagnosis not present

## 2018-05-12 DIAGNOSIS — N6325 Unspecified lump in the left breast, overlapping quadrants: Secondary | ICD-10-CM | POA: Diagnosis not present

## 2018-05-13 DIAGNOSIS — Z170 Estrogen receptor positive status [ER+]: Secondary | ICD-10-CM | POA: Diagnosis not present

## 2018-05-13 DIAGNOSIS — C50412 Malignant neoplasm of upper-outer quadrant of left female breast: Secondary | ICD-10-CM | POA: Diagnosis not present

## 2018-05-13 HISTORY — PX: MASTECTOMY, PARTIAL: SHX709

## 2018-05-23 ENCOUNTER — Encounter: Payer: No Typology Code available for payment source | Admitting: Family Medicine

## 2018-05-23 DIAGNOSIS — Z09 Encounter for follow-up examination after completed treatment for conditions other than malignant neoplasm: Secondary | ICD-10-CM | POA: Diagnosis not present

## 2018-05-23 DIAGNOSIS — C50412 Malignant neoplasm of upper-outer quadrant of left female breast: Secondary | ICD-10-CM | POA: Diagnosis not present

## 2018-05-23 DIAGNOSIS — Z1239 Encounter for other screening for malignant neoplasm of breast: Secondary | ICD-10-CM | POA: Diagnosis not present

## 2018-05-23 DIAGNOSIS — Z9889 Other specified postprocedural states: Secondary | ICD-10-CM | POA: Diagnosis not present

## 2018-05-23 DIAGNOSIS — Z170 Estrogen receptor positive status [ER+]: Secondary | ICD-10-CM | POA: Diagnosis not present

## 2018-05-27 ENCOUNTER — Encounter: Payer: No Typology Code available for payment source | Admitting: Family Medicine

## 2018-05-27 DIAGNOSIS — C50212 Malignant neoplasm of upper-inner quadrant of left female breast: Secondary | ICD-10-CM | POA: Diagnosis not present

## 2018-06-01 ENCOUNTER — Other Ambulatory Visit: Payer: Self-pay | Admitting: Family Medicine

## 2018-06-24 ENCOUNTER — Telehealth: Payer: Self-pay | Admitting: Family Medicine

## 2018-06-24 MED ORDER — OLMESARTAN MEDOXOMIL-HCTZ 20-12.5 MG PO TABS: 1.0000 | ORAL_TABLET | Freq: Every day | ORAL | 3 refills | Status: DC

## 2018-06-24 NOTE — Telephone Encounter (Signed)
Pt needs refill on benicar hctz [facility] [place]

## 2018-06-24 NOTE — Telephone Encounter (Signed)
Prescription sent to pharmacy.

## 2018-07-15 DIAGNOSIS — R300 Dysuria: Secondary | ICD-10-CM | POA: Diagnosis not present

## 2018-07-21 DIAGNOSIS — C50212 Malignant neoplasm of upper-inner quadrant of left female breast: Secondary | ICD-10-CM | POA: Diagnosis not present

## 2018-07-22 DIAGNOSIS — C50212 Malignant neoplasm of upper-inner quadrant of left female breast: Secondary | ICD-10-CM | POA: Diagnosis not present

## 2018-07-23 DIAGNOSIS — C50212 Malignant neoplasm of upper-inner quadrant of left female breast: Secondary | ICD-10-CM | POA: Diagnosis not present

## 2018-07-24 DIAGNOSIS — C50212 Malignant neoplasm of upper-inner quadrant of left female breast: Secondary | ICD-10-CM | POA: Diagnosis not present

## 2018-07-25 DIAGNOSIS — C50212 Malignant neoplasm of upper-inner quadrant of left female breast: Secondary | ICD-10-CM | POA: Diagnosis not present

## 2018-07-25 DIAGNOSIS — Z170 Estrogen receptor positive status [ER+]: Secondary | ICD-10-CM | POA: Diagnosis not present

## 2018-07-28 ENCOUNTER — Telehealth: Payer: Self-pay | Admitting: Family Medicine

## 2018-07-28 DIAGNOSIS — C50212 Malignant neoplasm of upper-inner quadrant of left female breast: Secondary | ICD-10-CM | POA: Diagnosis not present

## 2018-07-28 MED ORDER — METFORMIN HCL ER 500 MG PO TB24: ORAL_TABLET | ORAL | 3 refills | Status: DC

## 2018-07-28 NOTE — Telephone Encounter (Signed)
Prescription sent to pharmacy.

## 2018-07-28 NOTE — Telephone Encounter (Signed)
Patient calling to get 3 month supply refill on her metformin if possible  [facility] [place]

## 2018-07-29 DIAGNOSIS — C50212 Malignant neoplasm of upper-inner quadrant of left female breast: Secondary | ICD-10-CM | POA: Diagnosis not present

## 2018-07-30 ENCOUNTER — Other Ambulatory Visit: Payer: Self-pay | Admitting: Family Medicine

## 2018-07-30 ENCOUNTER — Encounter: Payer: No Typology Code available for payment source | Admitting: Family Medicine

## 2018-07-30 DIAGNOSIS — C50212 Malignant neoplasm of upper-inner quadrant of left female breast: Secondary | ICD-10-CM | POA: Diagnosis not present

## 2018-07-31 DIAGNOSIS — C50212 Malignant neoplasm of upper-inner quadrant of left female breast: Secondary | ICD-10-CM | POA: Diagnosis not present

## 2018-08-01 DIAGNOSIS — C50212 Malignant neoplasm of upper-inner quadrant of left female breast: Secondary | ICD-10-CM | POA: Diagnosis not present

## 2018-08-04 DIAGNOSIS — C50212 Malignant neoplasm of upper-inner quadrant of left female breast: Secondary | ICD-10-CM | POA: Diagnosis not present

## 2018-08-05 DIAGNOSIS — C50212 Malignant neoplasm of upper-inner quadrant of left female breast: Secondary | ICD-10-CM | POA: Diagnosis not present

## 2018-08-06 DIAGNOSIS — C50212 Malignant neoplasm of upper-inner quadrant of left female breast: Secondary | ICD-10-CM | POA: Diagnosis not present

## 2018-08-07 DIAGNOSIS — C50212 Malignant neoplasm of upper-inner quadrant of left female breast: Secondary | ICD-10-CM | POA: Diagnosis not present

## 2018-08-08 DIAGNOSIS — C50212 Malignant neoplasm of upper-inner quadrant of left female breast: Secondary | ICD-10-CM | POA: Diagnosis not present

## 2018-08-11 DIAGNOSIS — C50212 Malignant neoplasm of upper-inner quadrant of left female breast: Secondary | ICD-10-CM | POA: Diagnosis not present

## 2018-08-22 ENCOUNTER — Other Ambulatory Visit: Payer: Self-pay | Admitting: Family Medicine

## 2018-09-23 DIAGNOSIS — C50212 Malignant neoplasm of upper-inner quadrant of left female breast: Secondary | ICD-10-CM | POA: Diagnosis not present

## 2018-09-23 DIAGNOSIS — Z170 Estrogen receptor positive status [ER+]: Secondary | ICD-10-CM | POA: Diagnosis not present

## 2018-10-04 ENCOUNTER — Other Ambulatory Visit: Payer: Self-pay | Admitting: Family Medicine

## 2018-10-21 ENCOUNTER — Encounter: Payer: Self-pay | Admitting: Family Medicine

## 2018-11-04 DIAGNOSIS — L03031 Cellulitis of right toe: Secondary | ICD-10-CM | POA: Diagnosis not present

## 2018-11-18 DIAGNOSIS — L03031 Cellulitis of right toe: Secondary | ICD-10-CM | POA: Diagnosis not present

## 2018-12-08 DIAGNOSIS — Z20828 Contact with and (suspected) exposure to other viral communicable diseases: Secondary | ICD-10-CM | POA: Diagnosis not present

## 2019-01-14 ENCOUNTER — Telehealth: Payer: Self-pay | Admitting: *Deleted

## 2019-01-14 NOTE — Telephone Encounter (Signed)
Received PA questions.   Submitted information.   Your information has been submitted to [employer]. To check for an updated outcome later, reopen this PA request from your dashboard.  If [employer] has not responded to your request within 24 hours, contact [employer] at [phone]. If you think there may be a problem with your PA request, use our live chat feature at the bottom right.

## 2019-01-14 NOTE — Telephone Encounter (Signed)
Received request from pharmacy for PA on Trulicity.   Demographic data submitted. Awaiting PA questions.   Dx: E11.9- DM.

## 2019-01-20 MED ORDER — TRULICITY 0.75 MG/0.5ML ~~LOC~~ SOAJ: 0.7500 mg | SUBCUTANEOUS | 11 refills | Status: DC

## 2019-01-20 NOTE — Telephone Encounter (Signed)
Received PA determination.   PA approved 01/16/2019- 01/16/2020.  Pharmacy made aware.

## 2019-01-27 ENCOUNTER — Encounter: Payer: Self-pay | Admitting: *Deleted

## 2019-01-28 ENCOUNTER — Encounter: Payer: Self-pay | Admitting: Family Medicine

## 2019-02-08 ENCOUNTER — Other Ambulatory Visit: Payer: Self-pay | Admitting: Family Medicine

## 2019-03-04 DIAGNOSIS — H524 Presbyopia: Secondary | ICD-10-CM | POA: Diagnosis not present

## 2019-03-06 DIAGNOSIS — Z853 Personal history of malignant neoplasm of breast: Secondary | ICD-10-CM | POA: Diagnosis not present

## 2019-03-06 DIAGNOSIS — Z9889 Other specified postprocedural states: Secondary | ICD-10-CM | POA: Diagnosis not present

## 2019-03-06 DIAGNOSIS — N6489 Other specified disorders of breast: Secondary | ICD-10-CM | POA: Diagnosis not present

## 2019-03-06 DIAGNOSIS — Z1239 Encounter for other screening for malignant neoplasm of breast: Secondary | ICD-10-CM | POA: Diagnosis not present

## 2019-03-16 DIAGNOSIS — C44311 Basal cell carcinoma of skin of nose: Secondary | ICD-10-CM | POA: Diagnosis not present

## 2019-03-16 DIAGNOSIS — D485 Neoplasm of uncertain behavior of skin: Secondary | ICD-10-CM | POA: Diagnosis not present

## 2019-04-08 ENCOUNTER — Encounter: Payer: Self-pay | Admitting: Family Medicine

## 2019-04-08 ENCOUNTER — Ambulatory Visit (INDEPENDENT_AMBULATORY_CARE_PROVIDER_SITE_OTHER): Payer: No Typology Code available for payment source | Admitting: Family Medicine

## 2019-04-08 ENCOUNTER — Other Ambulatory Visit: Payer: Self-pay

## 2019-04-08 VITALS — BP 122/64 | HR 88 | Temp 98.7°F | Resp 16 | Ht 64.0 in | Wt 196.0 lb

## 2019-04-08 DIAGNOSIS — E119 Type 2 diabetes mellitus without complications: Secondary | ICD-10-CM | POA: Diagnosis not present

## 2019-04-08 DIAGNOSIS — K529 Noninfective gastroenteritis and colitis, unspecified: Secondary | ICD-10-CM | POA: Diagnosis not present

## 2019-04-08 DIAGNOSIS — Z170 Estrogen receptor positive status [ER+]: Secondary | ICD-10-CM

## 2019-04-08 DIAGNOSIS — R69 Illness, unspecified: Secondary | ICD-10-CM | POA: Diagnosis not present

## 2019-04-08 DIAGNOSIS — N950 Postmenopausal bleeding: Secondary | ICD-10-CM | POA: Diagnosis not present

## 2019-04-08 DIAGNOSIS — I10 Essential (primary) hypertension: Secondary | ICD-10-CM

## 2019-04-08 DIAGNOSIS — Z124 Encounter for screening for malignant neoplasm of cervix: Secondary | ICD-10-CM | POA: Diagnosis not present

## 2019-04-08 DIAGNOSIS — F3340 Major depressive disorder, recurrent, in remission, unspecified: Secondary | ICD-10-CM

## 2019-04-08 DIAGNOSIS — C50212 Malignant neoplasm of upper-inner quadrant of left female breast: Secondary | ICD-10-CM | POA: Diagnosis not present

## 2019-04-08 LAB — WET PREP FOR TRICH, YEAST, CLUE

## 2019-04-08 MED ORDER — ALPRAZOLAM 0.5 MG PO TABS: ORAL_TABLET | ORAL | 2 refills | Status: DC

## 2019-04-08 NOTE — Assessment & Plan Note (Signed)
Goal is an  A1c less than 7%.  Expect that her diabetes will be uncontrolled based on her fasting readings. She is on ACE inhibitor but is not on statin drug.  Today we will check the A1c along with metabolic panel.  Her lipid panel can be checked at her physical

## 2019-04-08 NOTE — Assessment & Plan Note (Signed)
Controlled no changes 

## 2019-04-08 NOTE — Patient Instructions (Addendum)
Return the stool cards when you can We will call with lab results Xanax refilled Referral to Dr. Leafy Ro [facility] F/U as previous

## 2019-04-08 NOTE — Assessment & Plan Note (Signed)
Continue effexor, xanax refilled

## 2019-04-08 NOTE — Progress Notes (Signed)
Subjective:    Patient ID: Sheena Ruiz, female    DOB: 06/02/1960, 59 y.o.   MRN: [id number]  Patient presents for Irregular Spotting (x3 weeks- brownish discharge at first, then treated for UTI/ BV/ Yeast- now pink to watery red) Patient here for vaginal bleeding but has not had follow-up on her chronic medical problems in over a year. Diagnosed with breast cancer back in February 2020.  She had lumpectomy performed with Sheena Ruiz as her oncologist. He did have recent mammogram performed in January 2021 which was unremarkable.  She completed adjuvant radiation therapy in June 2020.  They did discuss endocrine therapy with her but she declined.  Diabetes mellitus her last A1c noted in the chart was 7.7 back in January 2020 I do not see any labs otherwise.  SHe is currently taking metformin 2000 mg extended release once a day. Did not tolerate the Trulicity states that they gave her significant GI upset diarrhea so she is just been taking Metformin. Her CBGs on just the Metformin have ranged from 150-180's, when on antibiotics recently they went up to the 220s.      mixed hyperlipidemia with triglycerides 296 LDL 121 back in January 2020.  I have recommended starting Crestor 5 mg at that time.  She currently taking omega-3 and recently restarted and sterols.  She Decided not to start the statin drug.     Hypertension she is taking Benicar HCTZ.  No difficulty with her blood pressure.  Major depression her father passed away she was a caregiver for her last year.  Her mother passed away last week she had a funeral yesterday.  She is still taking Effexor 150 mg once a day extended release, he is out of her alprazolam and needs this refilled.   He had a stomach bug back in October since then she has had loose stool and significant indigestion.  Nexium has helped significantly with her indigestion but she still gets loose stools.  Do not think the loose stools are secondary to  the Metformin.  She did try taking probiotics but there was no significant improvement.  She denies any significant gas or bloating with the loose stools.  No matter what she eats she will have them.  March  2020 had colonoscopy which she had some polyps.    Vaginal bleeding- her last PAP Smear was in  2019 which was normal  She has been helping with her mother whose health has been declining until she passed away last week.  She was unable to get to the bathroom like she typically does felt like she was holding her urine a lot.  She started having UTI symptoms as well as a vaginal itch and discharge.  She called her husband who is her primary care physician he treated her with Cipro and Flagyl.  She took the Flagyl for only 4 days because it caused more GI upset.  She did take 1 dose of Diflucan which helped with the itching.  Her vaginal itching and UTI symptoms resolved but then she has had vaginal spotting which was initially heavy on and now it is remained at a lighter pink spotting for the past 2-1/2 weeks.  She gets cramping like she did when she had a menstrual cycle with the spotting. She has not had any other episodes of bleeding    Her husband is a primary care physician at a local clinic.  She has been getting some orders done through his office  including immunizations   She had shingles vaccine and pneumonia vaccine.   She Is also being followed will is podiatry at [facility] clinic Dr. Myrlene Ruiz dermatology- growth on the nose was recently removed but was benign  Review Of Systems:  GEN- denies fatigue, fever, weight loss,weakness, recent illness HEENT- denies eye drainage, change in vision, nasal discharge, CVS- denies chest pain, palpitations RESP- denies SOB, cough, wheeze ABD- denies N/V, change in stools, abd pain GU- denies dysuria, hematuria, dribbling, incontinence MSK- denies joint pain, muscle aches, injury Neuro- denies headache, dizziness, syncope, seizure  activity       Objective:    BP 122/64   Pulse 88   Temp 98.7 F (37.1 C) (Temporal)   Resp 16   Ht 5\' 4"  (1.626 m)   Wt 196 lb (88.9 kg)   LMP 03/06/2011   SpO2 99%   BMI 33.64 kg/m  GEN- NAD, alert and oriented x3 HEENT- PERRL, EOMI, non injected sclera, pink conjunctiva  Neck- Supple, no thyromegaly CVS- RRR, no murmur RESP-CTAB ABD-NABS,soft,NT,ND Psych- normal affect and mood  GU- normal external genitalia, with white discharge between labial creases, vaginal mucosa pink and moist, cervix visualized no growth, friable with use of swabs/PAP Smear brush ,minimal thin clear discharge, no CMT, no ovarian masses, uterus normal size EXT- No edema Pulses- Radial 2+        Assessment & Plan:      Problem List Items Addressed This Visit      Unprioritized   Chronic diarrhea    Unclear cause benign abdominal exam.  We will send her with stool studies.  She did have recent colonoscopy in 2020. She does not feel her Metformin is contributing to the problem.      Relevant Orders   Stool culture   Clostridium difficile Toxin B, Qualitative, Real-Time PCR([facility])   Fecal leukocytes   Fecal Globin By Immunochemistry   Ova and Parasite Examination   Diabetes ([place])    Goal is an  A1c less than 7%.  Expect that her diabetes will be uncontrolled based on her fasting readings. She is on ACE inhibitor but is not on statin drug.  Today we will check the A1c along with metabolic panel.  Her lipid panel can be checked at her physical      Relevant Orders   Hemoglobin A1c   Hypertension    Controlled no changes       Relevant Orders   CBC with Differential/Platelet   Comprehensive metabolic panel   Malignant neoplasm of upper-inner quadrant of left breast in female, estrogen receptor positive ([facility])   Relevant Medications   ALPRAZolam (XANAX) 0.5 MG tablet    Other Visit Diagnoses    Post-menopausal bleeding    -  Primary   Concern for postmenopausal bleeding.  She also  has history of breast cancer.  Pap smear obtained today along with wet prep but no infection noted on the swab Referral to GYN for further evaluation   Relevant Orders   WET PREP FOR [place], YEAST, CLUE (Completed)   Pap IG w/ reflex to HPV when ASC-U   Ambulatory referral to Obstetrics / Gynecology   Cervical cancer screening       Relevant Orders   Pap IG w/ reflex to HPV when ASC-U      Note: This dictation was prepared with Dragon dictation along with smaller phrase technology. Any transcriptional errors that result from this process are unintentional.

## 2019-04-08 NOTE — Assessment & Plan Note (Signed)
Unclear cause benign abdominal exam.  We will send her with stool studies.  She did have recent colonoscopy in 2020. She does not feel her Metformin is contributing to the problem.

## 2019-04-09 LAB — CBC WITH DIFFERENTIAL/PLATELET
Absolute Monocytes: 436 cells/uL (ref 200–950)
Basophils Absolute: 76 cells/uL (ref 0–200)
Basophils Relative: 0.7 %
Eosinophils Absolute: 153 cells/uL (ref 15–500)
Eosinophils Relative: 1.4 %
HCT: 39.3 % (ref 35.0–45.0)
Hemoglobin: 13 g/dL (ref 11.7–15.5)
Lymphs Abs: 3521 cells/uL (ref 850–3900)
MCH: 28.3 pg (ref 27.0–33.0)
MCHC: 33.1 g/dL (ref 32.0–36.0)
MCV: 85.4 fL (ref 80.0–100.0)
MPV: 9.3 fL (ref 7.5–12.5)
Monocytes Relative: 4 %
Neutro Abs: 6714 cells/uL (ref 1500–7800)
Neutrophils Relative %: 61.6 %
Platelets: 481 10*3/uL — ABNORMAL HIGH (ref 140–400)
RBC: 4.6 10*6/uL (ref 3.80–5.10)
RDW: 13.5 % (ref 11.0–15.0)
Total Lymphocyte: 32.3 %
WBC: 10.9 10*3/uL — ABNORMAL HIGH (ref 3.8–10.8)

## 2019-04-09 LAB — COMPREHENSIVE METABOLIC PANEL
AG Ratio: 1.9 (calc) (ref 1.0–2.5)
ALT: 29 U/L (ref 6–29)
AST: 31 U/L (ref 10–35)
Albumin: 4.3 g/dL (ref 3.6–5.1)
Alkaline phosphatase (APISO): 72 U/L (ref 37–153)
BUN: 14 mg/dL (ref 7–25)
CO2: 24 mmol/L (ref 20–32)
Calcium: 9.5 mg/dL (ref 8.6–10.4)
Chloride: 101 mmol/L (ref 98–110)
Creat: 0.87 mg/dL (ref 0.50–1.05)
Globulin: 2.3 g/dL (calc) (ref 1.9–3.7)
Glucose, Bld: 246 mg/dL — ABNORMAL HIGH (ref 65–99)
Potassium: 3.6 mmol/L (ref 3.5–5.3)
Sodium: 139 mmol/L (ref 135–146)
Total Bilirubin: 0.3 mg/dL (ref 0.2–1.2)
Total Protein: 6.6 g/dL (ref 6.1–8.1)

## 2019-04-09 LAB — PAP IG W/ RFLX HPV ASCU

## 2019-04-09 LAB — HEMOGLOBIN A1C
Hgb A1c MFr Bld: 9.7 % of total Hgb — ABNORMAL HIGH (ref <5.7)
Mean Plasma Glucose: 232 (calc)
eAG (mmol/L): 12.8 (calc)

## 2019-04-14 DIAGNOSIS — L292 Pruritus vulvae: Secondary | ICD-10-CM | POA: Diagnosis not present

## 2019-04-14 DIAGNOSIS — N950 Postmenopausal bleeding: Secondary | ICD-10-CM | POA: Diagnosis not present

## 2019-04-14 DIAGNOSIS — R102 Pelvic and perineal pain: Secondary | ICD-10-CM | POA: Diagnosis not present

## 2019-04-14 DIAGNOSIS — N898 Other specified noninflammatory disorders of vagina: Secondary | ICD-10-CM | POA: Diagnosis not present

## 2019-04-16 ENCOUNTER — Encounter: Payer: Self-pay | Admitting: *Deleted

## 2019-04-16 MED ORDER — GLIPIZIDE 5 MG PO TABS: 5.0000 mg | ORAL_TABLET | Freq: Two times a day (BID) | ORAL | 3 refills | Status: DC

## 2019-04-19 ENCOUNTER — Encounter: Payer: Self-pay | Admitting: Family Medicine

## 2019-04-19 DIAGNOSIS — E119 Type 2 diabetes mellitus without complications: Secondary | ICD-10-CM

## 2019-04-21 DIAGNOSIS — L821 Other seborrheic keratosis: Secondary | ICD-10-CM | POA: Diagnosis not present

## 2019-04-21 DIAGNOSIS — D1801 Hemangioma of skin and subcutaneous tissue: Secondary | ICD-10-CM | POA: Diagnosis not present

## 2019-04-21 DIAGNOSIS — L905 Scar conditions and fibrosis of skin: Secondary | ICD-10-CM | POA: Diagnosis not present

## 2019-04-21 DIAGNOSIS — L810 Postinflammatory hyperpigmentation: Secondary | ICD-10-CM | POA: Diagnosis not present

## 2019-04-21 DIAGNOSIS — D225 Melanocytic nevi of trunk: Secondary | ICD-10-CM | POA: Diagnosis not present

## 2019-04-21 DIAGNOSIS — L814 Other melanin hyperpigmentation: Secondary | ICD-10-CM | POA: Diagnosis not present

## 2019-04-22 ENCOUNTER — Other Ambulatory Visit: Payer: Self-pay | Admitting: Family Medicine

## 2019-04-22 MED ORDER — METFORMIN HCL ER 500 MG PO TB24: ORAL_TABLET | ORAL | 3 refills | Status: DC

## 2019-04-27 ENCOUNTER — Encounter: Payer: Self-pay | Admitting: Family Medicine

## 2019-04-29 ENCOUNTER — Other Ambulatory Visit: Payer: Self-pay

## 2019-04-29 ENCOUNTER — Encounter: Payer: Self-pay | Admitting: Family Medicine

## 2019-04-29 ENCOUNTER — Telehealth: Payer: Self-pay | Admitting: *Deleted

## 2019-04-29 ENCOUNTER — Ambulatory Visit (INDEPENDENT_AMBULATORY_CARE_PROVIDER_SITE_OTHER): Payer: No Typology Code available for payment source | Admitting: Family Medicine

## 2019-04-29 VITALS — BP 124/60 | HR 80 | Temp 98.5°F | Resp 16 | Ht 64.0 in | Wt 194.0 lb

## 2019-04-29 DIAGNOSIS — N390 Urinary tract infection, site not specified: Secondary | ICD-10-CM

## 2019-04-29 DIAGNOSIS — Z0000 Encounter for general adult medical examination without abnormal findings: Secondary | ICD-10-CM | POA: Diagnosis not present

## 2019-04-29 DIAGNOSIS — G8929 Other chronic pain: Secondary | ICD-10-CM | POA: Diagnosis not present

## 2019-04-29 DIAGNOSIS — M25511 Pain in right shoulder: Secondary | ICD-10-CM

## 2019-04-29 DIAGNOSIS — E782 Mixed hyperlipidemia: Secondary | ICD-10-CM

## 2019-04-29 DIAGNOSIS — I10 Essential (primary) hypertension: Secondary | ICD-10-CM | POA: Diagnosis not present

## 2019-04-29 DIAGNOSIS — G2581 Restless legs syndrome: Secondary | ICD-10-CM

## 2019-04-29 DIAGNOSIS — E119 Type 2 diabetes mellitus without complications: Secondary | ICD-10-CM | POA: Diagnosis not present

## 2019-04-29 LAB — URINALYSIS, ROUTINE W REFLEX MICROSCOPIC
Bilirubin Urine: NEGATIVE
Glucose, UA: NEGATIVE
Hgb urine dipstick: NEGATIVE
Ketones, ur: NEGATIVE
Leukocytes,Ua: NEGATIVE
Nitrite: NEGATIVE
Protein, ur: NEGATIVE
Specific Gravity, Urine: 1.02 (ref 1.001–1.03)
pH: 5.5 (ref 5.0–8.0)

## 2019-04-29 MED ORDER — SHINGRIX 50 MCG/0.5ML IM SUSR: 0.5000 mL | Freq: Once | INTRAMUSCULAR | 1 refills | Status: AC

## 2019-04-29 NOTE — Telephone Encounter (Signed)
-----   Message from Alycia Rossetti, MD sent at 04/29/2019  3:10 PM EST ----- Regarding: Send Shingrix to pharmacy

## 2019-04-29 NOTE — Patient Instructions (Signed)
Shingrix sent to pharmacy Referral to orthopedics F/U fasting labs- LAB VISIT F/U 4 Months OV

## 2019-04-29 NOTE — Progress Notes (Signed)
Subjective:    Patient ID: Sheena Ruiz, female    DOB: 10/23/1960, 59 y.o.   MRN: [id number]  Patient presents for Annual Exam (is not fasting), Arm Pain (R>L- numbness and pain- shoulders pop), and Restless Leg Syndrome Patient here for CPE.  Her last visit was in February.  At that time she presented secondary to postmenopausal bleeding.  Pap smear was obtained at that visit which was unremarkable.  She was referred to gynecology secondary to postmenopausal bleeding and history of breast cancer.  Cultures did not show any active infection.  She was scheduled for ultrasound which will be done Monday. .  They were considering using topical vaginal estrogen As her symptoms were not consistent with infection.  She also had repeat wet prep which did not show any bacterial vaginosis.  She states that she improved with the clindamycin she was having the burning itching discomfort which she has had in the past.  She does have history of UTIs which are worse after intercourse she was using Cipro as needed.    Diabetes mellitus uncontrolled A1c in February was 9.7%.  No nurse called her multiple times but there was no return in the call.  She did not tolerate Trulicity or Jardiance.  She is on maximum dose of Metformin while trying to avoid starting insulin therapy I recommend she start glipizide 5 mg twice a day which sheis taking .  Her fasting blood sugars recently have been Lowest CBG was 118, otherwise has been 120-150's.  she was referred to nutritionist as well waiting to be scheduled      RLS -keeps her up throughout the night, she really doesn't get good sleep until early moring. She is wearing compression hose 20-41mmg and taking tylenol pm which has helped a lot    She also complained of some blood in her stool.  As well as loose stools.  She was given stool cultures to return as well as fecal hemoglobin she has not done this yet. Stools have improved with the probiotics . Hold off  on stool cultures and fecal studies since she has had improvement  MDD-she continues to take her venlafaxine.  She has had a very difficult year with the passing of her parents and having to deal with the estate.  He had difficulties with her schoolwork.  She may not be able to complete the class and may have to withdraw if she needs documentation regarding her stressors over the past year she will let me know.  She is due for lipid panel, she is not on statin drug.  She did not tolerate fenofibrate.  Her last triglycerides were 296 and LDL 121 a year ago  Breast cancer she is followed by oncology.  Has appt Friday.   Mammogram is up-to-date he is status post left lumpectomy January had mammogram at [facility]    Colonoscopy up-to-date  Due for eye exam/foot exam last urine micro     History of recurrent UTI, was on Cipro 250mg  after intercourse    [place]    She also complains of right shoulder pain that is worsened over the years.  She has a grinding and popping that she can hear and feel when she twists her shoulder.  She gets the pain that also shoots into the upper arm.  Denies any tingling or numbness in the hands or forearm.  She does remember initially injuring it when she went kayaking a few years ago.  She would  like to proceed with evaluation and treatment since it is getting worse.    Review Of Systems:  GEN- denies fatigue, fever, weight loss,weakness, recent illness HEENT- denies eye drainage, change in vision, nasal discharge, CVS- denies chest pain, palpitations RESP- denies SOB, cough, wheeze ABD- denies N/V, change in stools, abd pain GU- denies dysuria, hematuria, dribbling, incontinence MSK- + joint pain, muscle aches, injury Neuro- denies headache, dizziness, syncope, seizure activity       Objective:    BP 124/60   Pulse 80   Temp 98.5 F (36.9 C) (Temporal)   Resp 16   Ht 5\' 4"  (1.626 m)   Wt 194 lb (88 kg)   LMP 03/06/2011   SpO2 95%    BMI 33.30 kg/m  GEN- NAD, alert and oriented x3 HEENT- PERRL, EOMI, non injected sclera, pink conjunctiva, MMM, oropharynx clear Neck- Supple, no thyromegaly CVS- RRR, no murmur RESP-CTAB ABD-NABS,soft,NT,ND Psych normal affect and mood. Musculoskeletal good range of motion of upper extremity but she does have a grinding and clicking sound when she rotates the shoulder joint.   EXT- No edema Pulses- Radial, DP- 2+        Assessment & Plan:      Problem List Items Addressed This Visit      Unprioritized   Diabetes ([place])    Fasting blood sugars are improving.  She does get some spikes midmorning even before eating.  Dietitian will be helpful to her to help her maintain her blood glucose depending on what food she is eating.  No change in Metformin and glipizide at this time      Relevant Orders   Microalbumin / creatinine urine ratio   HLD (hyperlipidemia)   Relevant Orders   Lipid panel   Basic metabolic panel   Hypertension    Blood pressure is controlled.  She will return for fasting labs for her cholesterol.      Relevant Orders   Basic metabolic panel    Other Visit Diagnoses    Routine general medical examination at a health care facility    -  Primary   CPE done.  Shingrix sent to pharmacy.  Patient is getting Covid vaccine first.   Relevant Orders   HIV Antibody (routine testing w rflx)   Hepatitis C antibody   Chronic right shoulder pain       Relevant Orders   Ambulatory referral to Orthopedic Surgery   RLS (restless legs syndrome)       Continue compression hose   Recurrent UTI       Recheck urinalysis and culture to ensure no infection.  We will consider Cipro or nitrofurantoin after intercourse as needed   Relevant Orders   Urinalysis, Routine w reflex microscopic (Completed)   Urine Culture      Note: This dictation was prepared with Dragon dictation along with smaller phrase technology. Any transcriptional errors that result from this process are  unintentional.

## 2019-04-29 NOTE — Assessment & Plan Note (Signed)
Fasting blood sugars are improving.  She does get some spikes midmorning even before eating.  Dietitian will be helpful to her to help her maintain her blood glucose depending on what food she is eating.  No change in Metformin and glipizide at this time

## 2019-04-29 NOTE — Assessment & Plan Note (Signed)
Blood pressure is controlled.  She will return for fasting labs for her cholesterol.

## 2019-04-30 ENCOUNTER — Other Ambulatory Visit: Payer: Self-pay

## 2019-04-30 ENCOUNTER — Other Ambulatory Visit: Payer: No Typology Code available for payment source

## 2019-04-30 DIAGNOSIS — Z0000 Encounter for general adult medical examination without abnormal findings: Secondary | ICD-10-CM

## 2019-04-30 DIAGNOSIS — I10 Essential (primary) hypertension: Secondary | ICD-10-CM | POA: Diagnosis not present

## 2019-04-30 DIAGNOSIS — E782 Mixed hyperlipidemia: Secondary | ICD-10-CM

## 2019-04-30 LAB — URINE CULTURE
MICRO NUMBER:: 10236110
Result:: NO GROWTH
SPECIMEN QUALITY:: ADEQUATE

## 2019-04-30 LAB — MICROALBUMIN / CREATININE URINE RATIO
Creatinine, Urine: 60 mg/dL (ref 20–275)
Microalb Creat Ratio: 20 mcg/mg creat (ref <30)
Microalb, Ur: 1.2 mg/dL

## 2019-05-01 ENCOUNTER — Telehealth: Payer: Self-pay | Admitting: *Deleted

## 2019-05-01 DIAGNOSIS — N390 Urinary tract infection, site not specified: Secondary | ICD-10-CM

## 2019-05-01 LAB — BASIC METABOLIC PANEL
BUN: 11 mg/dL (ref 7–25)
CO2: 22 mmol/L (ref 20–32)
Calcium: 9.7 mg/dL (ref 8.6–10.4)
Chloride: 103 mmol/L (ref 98–110)
Creat: 0.61 mg/dL (ref 0.50–1.05)
Glucose, Bld: 115 mg/dL — ABNORMAL HIGH (ref 65–99)
Potassium: 4.1 mmol/L (ref 3.5–5.3)
Sodium: 139 mmol/L (ref 135–146)

## 2019-05-01 LAB — LIPID PANEL
Cholesterol: 189 mg/dL (ref <200)
HDL: 32 mg/dL — ABNORMAL LOW (ref >=50)
LDL Cholesterol (Calc): 120 mg/dL (calc) — ABNORMAL HIGH
Non-HDL Cholesterol (Calc): 157 mg/dL (calc) — ABNORMAL HIGH (ref <130)
Total CHOL/HDL Ratio: 5.9 (calc) — ABNORMAL HIGH (ref <5.0)
Triglycerides: 258 mg/dL — ABNORMAL HIGH (ref <150)

## 2019-05-01 LAB — EXTRA LAV TOP TUBE

## 2019-05-01 LAB — HEPATITIS C ANTIBODY
Hepatitis C Ab: NONREACTIVE
SIGNAL TO CUT-OFF: 0.01 (ref <1.00)

## 2019-05-01 LAB — HIV ANTIBODY (ROUTINE TESTING W REFLEX): HIV 1&2 Ab, 4th Generation: NONREACTIVE

## 2019-05-01 NOTE — Telephone Encounter (Signed)
Received call from patient.   Reports that she would like referral to [facility] to Dr Cindra Presume for recurrent UTI.   Ok to order?

## 2019-05-03 NOTE — Telephone Encounter (Signed)
Okay to place referral

## 2019-05-05 ENCOUNTER — Encounter: Payer: Self-pay | Admitting: Family Medicine

## 2019-05-05 ENCOUNTER — Ambulatory Visit: Payer: No Typology Code available for payment source | Admitting: *Deleted

## 2019-05-06 ENCOUNTER — Encounter: Payer: Self-pay | Admitting: Family Medicine

## 2019-05-06 DIAGNOSIS — M79672 Pain in left foot: Secondary | ICD-10-CM | POA: Diagnosis not present

## 2019-05-06 DIAGNOSIS — M7989 Other specified soft tissue disorders: Secondary | ICD-10-CM | POA: Diagnosis not present

## 2019-05-06 DIAGNOSIS — S92352A Displaced fracture of fifth metatarsal bone, left foot, initial encounter for closed fracture: Secondary | ICD-10-CM | POA: Diagnosis not present

## 2019-05-06 DIAGNOSIS — M8430XD Stress fracture, unspecified site, subsequent encounter for fracture with routine healing: Secondary | ICD-10-CM

## 2019-05-06 DIAGNOSIS — N951 Menopausal and female climacteric states: Secondary | ICD-10-CM

## 2019-05-06 DIAGNOSIS — M8580 Other specified disorders of bone density and structure, unspecified site: Secondary | ICD-10-CM

## 2019-05-07 ENCOUNTER — Encounter: Payer: Self-pay | Admitting: Family Medicine

## 2019-05-07 NOTE — Telephone Encounter (Signed)
Order placed

## 2019-05-12 ENCOUNTER — Encounter: Payer: No Typology Code available for payment source | Attending: Family Medicine | Admitting: *Deleted

## 2019-05-12 ENCOUNTER — Other Ambulatory Visit: Payer: Self-pay

## 2019-05-12 ENCOUNTER — Encounter: Payer: Self-pay | Admitting: *Deleted

## 2019-05-12 VITALS — BP 112/70 | Ht 63.0 in | Wt 192.5 lb

## 2019-05-12 DIAGNOSIS — Z713 Dietary counseling and surveillance: Secondary | ICD-10-CM | POA: Diagnosis not present

## 2019-05-12 DIAGNOSIS — E119 Type 2 diabetes mellitus without complications: Secondary | ICD-10-CM | POA: Diagnosis not present

## 2019-05-12 NOTE — Patient Instructions (Signed)
Check blood sugars 2 x day before breakfast and 2 hrs after one meal every day Bring blood sugar records to the next class  Exercise: Walk as tolerated   Eat 3 meals day,  1-2  snacks a day Space meals 4-6 hours apart Allow 2-3 hours between meals and snacks  Carry fast acting glucose and a snack at all times  Return for classes on:

## 2019-05-13 DIAGNOSIS — S92352A Displaced fracture of fifth metatarsal bone, left foot, initial encounter for closed fracture: Secondary | ICD-10-CM | POA: Diagnosis not present

## 2019-05-13 DIAGNOSIS — M7672 Peroneal tendinitis, left leg: Secondary | ICD-10-CM | POA: Diagnosis not present

## 2019-05-13 DIAGNOSIS — M79672 Pain in left foot: Secondary | ICD-10-CM | POA: Diagnosis not present

## 2019-05-13 DIAGNOSIS — M25475 Effusion, left foot: Secondary | ICD-10-CM | POA: Diagnosis not present

## 2019-05-13 NOTE — Progress Notes (Signed)
Diabetes Self-Management Education  Visit Type: First/Initial  Appt. Start Time: 1535 Appt. End Time: [id number]  05/12/2019  [name], identified by name and date of birth, is a 59 y.o. female with a diagnosis of Diabetes: Type 2.   ASSESSMENT  Blood pressure 112/70, height 5\' 3"  (1.6 m), weight 192 lb 8 oz (87.3 kg), last menstrual period 03/06/2011. Body mass index is 34.1 kg/m.  Diabetes Self-Management Education - 05/12/19 1707      Visit Information   Visit Type  First/Initial      Initial Visit   Diabetes Type  Type 2    Are you currently following a meal plan?  Yes    What type of meal plan do you follow?  "lower carb"    Are you taking your medications as prescribed?  Yes    Date Diagnosed  "unsure" - maybe 5-10 years      Health Coping   How would you rate your overall health?  Good;Fair      Psychosocial Assessment   Patient Belief/Attitude about Diabetes  Motivated to manage diabetes   "frustrated"   Self-care barriers  None    Self-management support  Doctor's office;Family;Internet communities    Patient Concerns  Nutrition/Meal planning;Glycemic Control;Monitoring;Weight Control;Healthy Lifestyle    Special Needs  None    Preferred Learning Style  Auditory;Visual;Hands on    [place] in progress    How often do you need to have someone help you when you read instructions, pamphlets, or other written materials from your doctor or pharmacy?  1 - Never    What is the last grade level you completed in school?  graduate      Pre-Education Assessment   Patient understands the diabetes disease and treatment process.  Needs Review    Patient understands incorporating nutritional management into lifestyle.  Needs Review    Patient undertands incorporating physical activity into lifestyle.  Needs Instruction    Patient understands using medications safely.  Needs Review    Patient understands monitoring blood glucose, interpreting and using  results  Needs Review    Patient understands prevention, detection, and treatment of acute complications.  Needs Instruction    Patient understands prevention, detection, and treatment of chronic complications.  Needs Review    Patient understands how to develop strategies to address psychosocial issues.  Needs Instruction    Patient understands how to develop strategies to promote health/change behavior.  Needs Instruction      Complications   Last HgB A1C per patient/outside source  9.7 %   04/08/2019   How often do you check your blood sugar?  3-4 times/day    Fasting Blood glucose range (mg/dL)  70-129;130-179   FBG today was 125 mg/dL but some readings have ranged from 152-170 mg/dL.   Postprandial Blood glucose range (mg/dL)  130-179   reports current average is 150 mg/dL   Have you had a dilated eye exam in the past 12 months?  Yes    Have you had a dental exam in the past 12 months?  Yes    Are you checking your feet?  Yes    How many days per week are you checking your feet?  7      Dietary Intake   Breakfast  milk, cereal, blueberries, walnuts; 2 boiled eggs, blueberies, toast, walnuts    Snack (morning)  Atkins bar, peanut butter bread, peanut butter crackers    Lunch  chicken salad, kale, carrots,  blueberries, bananas, toast    Snack (afternoon)  1/2 banana and walnuts or peanut butter, kale    Dinner  chicken, beef, fish, pork, nuts, pumpkin or sunflower seeds with carrots, kale, tomatoes, cuccumbers, berries, olive oil    Beverage(s)  water, unsweetened coconut milk, regular milk, diet soda      Exercise   Exercise Type  ADL's   exercise limited - has boot on left foot for fracture     Patient Education   Previous Diabetes Education  No    Disease state   Factors that contribute to the development of diabetes    Nutrition management   Role of diet in the treatment of diabetes and the relationship between the three main macronutrients and blood glucose level;Food label  reading, portion sizes and measuring food.;Carbohydrate counting;Reviewed blood glucose goals for pre and post meals and how to evaluate the patients' food intake on their blood glucose level.;Meal timing in regards to the patients' current diabetes medication.    Physical activity and exercise   Role of exercise on diabetes management, blood pressure control and cardiac health.    Medications  Reviewed patients medication for diabetes, action, purpose, timing of dose and side effects.    Monitoring  Purpose and frequency of SMBG.;Taught/discussed recording of test results and interpretation of SMBG.;Identified appropriate SMBG and/or A1C goals.    Acute complications  Taught treatment of hypoglycemia - the 15 rule.    Chronic complications  Relationship between chronic complications and blood glucose control    Psychosocial adjustment  Identified and addressed patients feelings and concerns about diabetes;Role of stress on diabetes   Her parents died within 6 months of each other.     Individualized Goals (developed by patient)   Reducing Risk  Other (comment)   Improve blood sugars, prevent diabetes complications, lose weight, lead a healthier lifestyle, become more fit     Outcomes   Expected Outcomes  Demonstrated interest in learning. Expect positive outcomes       Individualized Plan for Diabetes Self-Management Training:   Learning Objective:  Patient will have a greater understanding of diabetes self-management. Patient education plan is to attend individual and/or group sessions per assessed needs and concerns.   Plan:   Patient Instructions  Check blood sugars 2 x day before breakfast and 2 hrs after one meal every day Bring blood sugar records to the next class Exercise: Walk as tolerated  Eat 3 meals day,  1-2  snacks a day Space meals 4-6 hours apart Allow 2-3 hours between meals and snacks Carry fast acting glucose and a snack at all times  Expected Outcomes:   Demonstrated interest in learning. Expect positive outcomes  Education material provided:  General Meal Planning Guidelines Simple Meal Plan Glucose tablets Symptoms, causes and treatments of Hypoglycemia  If problems or questions, patient to contact team via:  Sheena Drilling, RN, [place] [phone]  Future DSME appointment:  May 18, 2019 for Diabetes Class 1

## 2019-05-18 ENCOUNTER — Other Ambulatory Visit: Payer: Self-pay

## 2019-05-18 ENCOUNTER — Other Ambulatory Visit: Payer: Self-pay | Admitting: Family Medicine

## 2019-05-18 ENCOUNTER — Telehealth: Payer: Self-pay | Admitting: *Deleted

## 2019-05-18 NOTE — Telephone Encounter (Signed)
Received voicemail from patient that she would not be able to come to diabetes class tonight. She has a migraine and is not able to drive.

## 2019-05-19 ENCOUNTER — Encounter: Payer: Self-pay | Admitting: Family Medicine

## 2019-05-19 ENCOUNTER — Telehealth: Payer: Self-pay | Admitting: *Deleted

## 2019-05-19 MED ORDER — VENLAFAXINE HCL ER 150 MG PO CP24: 150.0000 mg | ORAL_CAPSULE | Freq: Every day | ORAL | 3 refills | Status: DC

## 2019-05-19 NOTE — Telephone Encounter (Signed)
Phone call to reschedule classes. Pt unable to attend Diabetes Class 1 last night. Left message with dates for next Thurs morning series beginning May 6 and Mon evening series beginning April 26. Asked her to call back and confirm.

## 2019-05-20 DIAGNOSIS — N950 Postmenopausal bleeding: Secondary | ICD-10-CM | POA: Diagnosis not present

## 2019-05-20 DIAGNOSIS — R9389 Abnormal findings on diagnostic imaging of other specified body structures: Secondary | ICD-10-CM | POA: Diagnosis not present

## 2019-05-21 ENCOUNTER — Telehealth: Payer: Self-pay | Admitting: *Deleted

## 2019-05-21 NOTE — Telephone Encounter (Signed)
Received voice mail from patient regarding classes. Called patient and she will begin Diabetes class series starting May 6.

## 2019-05-27 ENCOUNTER — Encounter: Payer: Self-pay | Admitting: Family Medicine

## 2019-05-29 MED ORDER — GLIPIZIDE 5 MG PO TABS: 7.5000 mg | ORAL_TABLET | Freq: Two times a day (BID) | ORAL | 3 refills | Status: DC

## 2019-05-29 NOTE — Addendum Note (Signed)
Addended by: Sheral Flow on: 05/29/2019 04:45 PM   Modules accepted: Orders

## 2019-05-29 NOTE — Telephone Encounter (Signed)
Call placed to patient. [place].   Message sent via [place].

## 2019-06-04 ENCOUNTER — Ambulatory Visit: Payer: No Typology Code available for payment source

## 2019-06-10 DIAGNOSIS — Z124 Encounter for screening for malignant neoplasm of cervix: Secondary | ICD-10-CM | POA: Diagnosis not present

## 2019-06-10 DIAGNOSIS — R9389 Abnormal findings on diagnostic imaging of other specified body structures: Secondary | ICD-10-CM | POA: Diagnosis not present

## 2019-06-10 DIAGNOSIS — N882 Stricture and stenosis of cervix uteri: Secondary | ICD-10-CM | POA: Diagnosis not present

## 2019-06-10 DIAGNOSIS — N950 Postmenopausal bleeding: Secondary | ICD-10-CM | POA: Diagnosis not present

## 2019-06-11 ENCOUNTER — Ambulatory Visit: Payer: No Typology Code available for payment source

## 2019-06-12 DIAGNOSIS — M7582 Other shoulder lesions, left shoulder: Secondary | ICD-10-CM | POA: Diagnosis not present

## 2019-06-12 DIAGNOSIS — M7521 Bicipital tendinitis, right shoulder: Secondary | ICD-10-CM | POA: Diagnosis not present

## 2019-06-12 DIAGNOSIS — M25511 Pain in right shoulder: Secondary | ICD-10-CM | POA: Diagnosis not present

## 2019-06-12 DIAGNOSIS — M25512 Pain in left shoulder: Secondary | ICD-10-CM | POA: Diagnosis not present

## 2019-06-12 DIAGNOSIS — M7581 Other shoulder lesions, right shoulder: Secondary | ICD-10-CM | POA: Diagnosis not present

## 2019-06-15 DIAGNOSIS — M79672 Pain in left foot: Secondary | ICD-10-CM | POA: Diagnosis not present

## 2019-06-15 DIAGNOSIS — M216X1 Other acquired deformities of right foot: Secondary | ICD-10-CM | POA: Diagnosis not present

## 2019-06-15 DIAGNOSIS — M7672 Peroneal tendinitis, left leg: Secondary | ICD-10-CM | POA: Diagnosis not present

## 2019-06-15 DIAGNOSIS — S92352D Displaced fracture of fifth metatarsal bone, left foot, subsequent encounter for fracture with routine healing: Secondary | ICD-10-CM | POA: Diagnosis not present

## 2019-06-17 ENCOUNTER — Encounter: Payer: Self-pay | Admitting: Family Medicine

## 2019-06-17 DIAGNOSIS — Z780 Asymptomatic menopausal state: Secondary | ICD-10-CM | POA: Diagnosis not present

## 2019-06-17 LAB — HM DEXA SCAN

## 2019-06-24 ENCOUNTER — Other Ambulatory Visit: Payer: Self-pay | Admitting: Family Medicine

## 2019-06-25 ENCOUNTER — Encounter: Payer: Self-pay | Admitting: Dietician

## 2019-06-25 ENCOUNTER — Ambulatory Visit: Payer: No Typology Code available for payment source

## 2019-06-25 ENCOUNTER — Encounter: Payer: Self-pay | Admitting: *Deleted

## 2019-06-25 ENCOUNTER — Encounter: Payer: Self-pay | Admitting: Family Medicine

## 2019-06-25 ENCOUNTER — Telehealth: Payer: No Typology Code available for payment source | Admitting: Emergency Medicine

## 2019-06-25 DIAGNOSIS — R300 Dysuria: Secondary | ICD-10-CM | POA: Diagnosis not present

## 2019-06-25 MED ORDER — NITROFURANTOIN MONOHYD MACRO 100 MG PO CAPS
100.0000 mg | ORAL_CAPSULE | Freq: Two times a day (BID) | ORAL | 0 refills | Status: DC
Start: 2019-06-25 — End: 2019-08-31

## 2019-06-25 MED ORDER — EFFEXOR XR 150 MG PO CP24: 150.0000 mg | ORAL_CAPSULE | Freq: Every day | ORAL | 3 refills | Status: AC

## 2019-06-25 NOTE — Progress Notes (Signed)
Patient cancelled her attendance for Diabetes class 1 today; not feeling well.

## 2019-06-25 NOTE — Progress Notes (Signed)
We are sorry that you are not feeling well.  Here is how we plan to help!  Based on what you shared with me it looks like you most likely have a simple urinary tract infection.  A UTI (Urinary Tract Infection) is a bacterial infection of the bladder.  Most cases of urinary tract infections are simple to treat but a key part of your care is to encourage you to drink plenty of fluids and watch your symptoms carefully.  I have prescribed MacroBid 100 mg twice a day for 5 days.  Your symptoms should gradually improve. Call us if the burning in your urine worsens, you develop worsening fever, back pain or pelvic pain or if your symptoms do not resolve after completing the antibiotic.  Urinary tract infections can be prevented by drinking plenty of water to keep your body hydrated.  Also be sure when you wipe, wipe from front to back and don't hold it in!  If possible, empty your bladder every 4 hours.  Your e-visit answers were reviewed by a board certified advanced clinical practitioner to complete your personal care plan.  Depending on the condition, your plan could have included both over the counter or prescription medications.  If there is a problem please reply  once you have received a response from your provider.  Your safety is important to us.  If you have drug allergies check your prescription carefully.    You can use MyChart to ask questions about today's visit, request a non-urgent call back, or ask for a work or school excuse for 24 hours related to this e-Visit. If it has been greater than 24 hours you will need to follow up with your provider, or enter a new e-Visit to address those concerns.   You will get an e-mail in the next two days asking about your experience.  I hope that your e-visit has been valuable and will speed your recovery. Thank you for using e-visits.   Approximately 5 minutes was used in reviewing the patient's chart, questionnaire, prescribing medications, and  documentation.  

## 2019-06-26 ENCOUNTER — Telehealth: Payer: Self-pay | Admitting: *Deleted

## 2019-06-26 ENCOUNTER — Telehealth: Payer: Self-pay | Admitting: Dietician

## 2019-06-26 NOTE — Telephone Encounter (Signed)
Received request from pharmacy for PA on name brand Effexor.   PA submitted.   Dx: [id number]

## 2019-06-26 NOTE — Telephone Encounter (Signed)
Called patient to reschedule her cancelled class from 06/25/19. Left a voicemail message with next available classes and requested a call back.

## 2019-06-26 NOTE — Telephone Encounter (Signed)
Your PA has been faxed to the plan as a paper copy. Please contact the plan directly if you haven't received a determination in a typical timeframe.  You will be notified of the determination via fax. 

## 2019-06-30 ENCOUNTER — Encounter: Payer: Self-pay | Admitting: Family Medicine

## 2019-07-01 ENCOUNTER — Encounter: Payer: Self-pay | Admitting: *Deleted

## 2019-07-02 ENCOUNTER — Ambulatory Visit: Payer: No Typology Code available for payment source

## 2019-07-08 DIAGNOSIS — R350 Frequency of micturition: Secondary | ICD-10-CM | POA: Diagnosis not present

## 2019-07-08 DIAGNOSIS — R9389 Abnormal findings on diagnostic imaging of other specified body structures: Secondary | ICD-10-CM | POA: Diagnosis not present

## 2019-07-08 DIAGNOSIS — Z01818 Encounter for other preprocedural examination: Secondary | ICD-10-CM | POA: Diagnosis not present

## 2019-07-08 DIAGNOSIS — N950 Postmenopausal bleeding: Secondary | ICD-10-CM | POA: Diagnosis not present

## 2019-07-08 DIAGNOSIS — N390 Urinary tract infection, site not specified: Secondary | ICD-10-CM | POA: Diagnosis not present

## 2019-07-08 DIAGNOSIS — R300 Dysuria: Secondary | ICD-10-CM | POA: Diagnosis not present

## 2019-07-09 ENCOUNTER — Ambulatory Visit: Payer: No Typology Code available for payment source

## 2019-07-09 NOTE — Telephone Encounter (Signed)
Received fax from [place].   Advised that records show that this is not a member that they process claims for.

## 2019-07-10 ENCOUNTER — Encounter: Payer: Self-pay | Admitting: *Deleted

## 2019-07-13 ENCOUNTER — Encounter: Payer: Self-pay | Admitting: Family Medicine

## 2019-07-13 MED ORDER — GLIPIZIDE 5 MG PO TABS: 7.5000 mg | ORAL_TABLET | Freq: Two times a day (BID) | ORAL | 3 refills | Status: DC

## 2019-07-13 MED ORDER — METFORMIN HCL ER 500 MG PO TB24: ORAL_TABLET | ORAL | 3 refills | Status: DC

## 2019-07-15 ENCOUNTER — Other Ambulatory Visit: Payer: Self-pay | Admitting: Obstetrics and Gynecology

## 2019-07-21 DIAGNOSIS — Z170 Estrogen receptor positive status [ER+]: Secondary | ICD-10-CM | POA: Diagnosis not present

## 2019-07-21 DIAGNOSIS — C50212 Malignant neoplasm of upper-inner quadrant of left female breast: Secondary | ICD-10-CM | POA: Diagnosis not present

## 2019-07-22 DIAGNOSIS — M79672 Pain in left foot: Secondary | ICD-10-CM | POA: Diagnosis not present

## 2019-07-22 DIAGNOSIS — M7672 Peroneal tendinitis, left leg: Secondary | ICD-10-CM | POA: Diagnosis not present

## 2019-07-22 DIAGNOSIS — S92352D Displaced fracture of fifth metatarsal bone, left foot, subsequent encounter for fracture with routine healing: Secondary | ICD-10-CM | POA: Diagnosis not present

## 2019-07-22 DIAGNOSIS — M216X1 Other acquired deformities of right foot: Secondary | ICD-10-CM | POA: Diagnosis not present

## 2019-07-25 ENCOUNTER — Telehealth: Payer: No Typology Code available for payment source | Admitting: Nurse Practitioner

## 2019-07-25 DIAGNOSIS — R300 Dysuria: Secondary | ICD-10-CM

## 2019-07-25 DIAGNOSIS — M545 Low back pain, unspecified: Secondary | ICD-10-CM

## 2019-07-25 NOTE — Progress Notes (Signed)
Based on what you shared with me it looks like you have urinary tract infection with associating back pain,that should be evaluated in a face to face office visit. You need an  urinalysis and urine culture in order to get proper treatment.    NOTE: If you entered your credit card information for this eVisit, you will not be charged. You may see a "hold" on your card for the $35 but that hold will drop off and you will not have a charge processed.  If you are having a true medical emergency please call 911.     For an urgent face to face visit, [place] has four urgent care centers for your convenience:   . [facility]    [phone]                  Get Driving Directions  [address], [place] [address] . 10 am to 8 pm Monday-Friday . 12 pm to 8 pm Saturday-Sunday   . [facility] [place]                  Get Driving Directions  [address], [place], [place] [address] . 8 am to 8 pm Monday-Friday . 9 am to 6 pm Saturday . 11 am to 6 pm Sunday   . [facility] at [place]                  Get Driving Directions   [address] [place] [address] . 8 am to 8 pm Monday-Friday . 8 am to 4 pm Saturday-Sunday    . [facility] [place]                    Get Driving Directions  [phone]  [address], [place], [place] [address]  . Monday-Friday, 12 PM to 6 PM    Your e-visit answers were reviewed by a board certified advanced clinical practitioner to complete your personal care plan.  Thank you for using e-Visits.

## 2019-08-03 DIAGNOSIS — Z03818 Encounter for observation for suspected exposure to other biological agents ruled out: Secondary | ICD-10-CM | POA: Diagnosis not present

## 2019-08-03 DIAGNOSIS — Z1159 Encounter for screening for other viral diseases: Secondary | ICD-10-CM | POA: Diagnosis not present

## 2019-08-03 DIAGNOSIS — Z20828 Contact with and (suspected) exposure to other viral communicable diseases: Secondary | ICD-10-CM | POA: Diagnosis not present

## 2019-08-18 NOTE — H&P (Signed)
Chief Complaint:   Ms. Rodenberg is a 59 y.o. female here for Surgical Work Up  History Present Illness:  Patient presents for a preoperative visit to schedule a D&C, hysteroscopy. Indications include PMB, thickened endometrial stripe on ultrasound >28mm, and cervical stenosis. Patient's endometrial biopsy and pap smear were difficult to collect due to cervical stenosis and both results returned with insufficient tissue/cells.   Workup has included: Pap smear 05/2019: unsatisfactory/negative HPV  EMBx 05/2019: Minute fragments of benign endocervical glands, squamous epithelium, blood, and mucus. No endometrial tissue identified.   TVUS3/31/2021: Fibroid Ut Ut=5.18 x 2.76 x 4.55 cm 1 rt post=18 mm 2 rt ant= 15 mm Endometrium=4.72 mm Previous ablation  bil ovs wnl  Pertinent Hx: -C/S x1 -S/p BTL -Painful intercourse sometimes and the pain is a dryness pain and also the pain that was triggered in theTVUS -Consider trial of vaginal estrogen for decreased UTI flares -1 NSVD, 1 C/S -Diagnosed with breast cancer in February 2020. She had lumpectomy performed with [place] as her oncologist.  Past Medical History:  has a past medical history of Anesthesia complication, Depression, Diabetes mellitus type 2, uncomplicated (CMS-HCC), Hyperlipidemia, Hypertension, and Malignant neoplasm of upper-inner quadrant of left breast in female, estrogen receptor positive (CMS-HCC) (03/05/2018).  Past Surgical History:  has a past surgical history that includes Total ankle replacement (2012); Cholecystectomy (1997); Cesarean section (2003); Colonoscopy (09/19/2011, 12/27/2006); Tubal ligation; foreign body removal  (Right); Percutaneous Biopsy Breast (Left, 03/21/2018); Other Surgical History (2001); Nasal Surgery  (2005); Colonoscopy (04/22/2018); mastectomy partial (Left, 05/13/2018); and biopsy/excision lymph node axillary (Left, 05/13/2018). Family History: family history  includes Allergic rhinitis in her maternal grandfather and maternal grandmother; Asthma in her maternal grandfather and maternal grandmother; Breast cancer in her cousin, cousin, and paternal aunt; Cervical cancer in her mother; Colon cancer in her father, maternal uncle, and paternal aunt; Colon polyps in her father; Depression in her brother, father, maternal grandfather, maternal grandmother, mother, paternal grandfather, and paternal grandmother; Diabetes type II in her maternal grandmother and paternal grandmother; High blood pressure (Hypertension) in her brother, father, maternal grandfather, maternal grandmother, mother, paternal grandfather, paternal grandmother, and sister; Liver cancer in her maternal uncle; Lung cancer in her maternal uncle and maternal uncle; Myocardial Infarction (Heart attack) in her maternal grandfather, paternal grandfather, and paternal grandmother; Osteoporosis (Thinning of bones) in her maternal grandmother, mother, and paternal grandmother; Ovarian cancer in her paternal aunt; Prostate cancer in her father; Stroke in her father, maternal grandmother, and paternal grandmother; Uterine cancer in her mother. Social History:  reports that she has never smoked. She has never used smokeless tobacco. She reports current alcohol use. She reports that she does not use drugs. OB/GYN History:          OB History    Gravida  4   Para  2   Term  2   Preterm      AB  2   Living  2     SAB  2   TAB      Ectopic      Molar      Multiple      Live Births           Allergies: is allergic to doxycycline, dulaglutide, empagliflozin, fenofibrate micronized, and penicillins. Medications:  Current Outpatient Medications:  .  ALPRAZolam (XANAX) 0.5 MG tablet, Take 1 tablet (0.5 mg total) by mouth as needed for Anxiety (take 1-2 tablets 30-45 min prior to MRI) for  up to 2 doses, Disp: 2 tablet, Rfl: 0 .  EFFEXOR XR 150 mg XR capsule, TAKE 1 CAPSULE BY MOUTH ONCE  DAILY (Patient taking differently: every morning   ), Disp: 30 capsule, Rfl: 5 .  glipiZIDE (GLUCOTROL) 5 MG tablet, , Disp: , Rfl:  .  metFORMIN (GLUCOPHAGE) 1000 MG tablet, Take 2,000 mg by mouth every morning   , Disp: , Rfl: 3 .  multivitamin tablet, Take 1 tablet by mouth every morning   , Disp: , Rfl:  .  olmesartan-hydrochlorothiazide (BENICAR HCT) 20-12.5 mg tablet, TAKE 1 TABLET BY MOUTH ONCE DAILY (Patient taking differently: Take 1 tablet by mouth every morning   ), Disp: 30 tablet, Rfl: 5   Exam:   BP 114/70   Ht 161.9 cm (5' 3.75")   Wt 87.5 kg (193 lb)   BMI 33.39 kg/m   General: Patient is well-groomed, well-nourished, appears stated age in no acute distress  HEENT: head is atraumatic and normocephalic, trachea is midline, neck is supple with no palpable nodules  CV: Regular rhythm and normal heart rate, no murmur  Pulm: Clear to auscultation throughout lung fields with no wheezing, crackles, or rhonchi. No increased work of breathing  Impression:   The primary encounter diagnosis was Pre-operative clearance. Diagnoses of Post-menopausal bleeding, Endometrial thickening on ultrasound, Recurrent UTI (urinary tract infection), Dysuria, and Urinary frequency were also pertinent to this visit.  Plan:   1.  Preoperative visit: D&C hysteroscopy. Consents signed today. -Risks of surgery were discussed with the patient including but not limited to: bleeding which may require transfusion; infection which may require antibiotics; injury to uterus or surrounding organs; intrauterine scarring which may impair future fertility; need for additional procedures including laparotomy or laparoscopy; and other postoperative/anesthesia complications. Written informed consent was obtained.  -Will send in vaginal estrogen cream for patient to use 4 weeks prior to her surgery for repeat pap smear prep.  -Will obtain pap smear at the time of surgery as well

## 2019-08-21 ENCOUNTER — Other Ambulatory Visit: Payer: Self-pay

## 2019-08-21 ENCOUNTER — Encounter
Admission: RE | Admit: 2019-08-21 | Discharge: 2019-08-21 | Disposition: A | Discharge status: Home / Self Care | Payer: 59 | Source: Ambulatory Visit | Attending: Obstetrics and Gynecology | Admitting: Obstetrics and Gynecology

## 2019-08-21 HISTORY — DX: Malignant (primary) neoplasm, unspecified: C80.1

## 2019-08-21 HISTORY — DX: Personal history of other diseases of the digestive system: Z87.19

## 2019-08-21 HISTORY — DX: Personal history of urinary calculi: Z87.442

## 2019-08-21 HISTORY — DX: Headache, unspecified: R51.9

## 2019-08-21 HISTORY — DX: Unspecified osteoarthritis, unspecified site: M19.90

## 2019-08-21 HISTORY — DX: Gastro-esophageal reflux disease without esophagitis: K21.9

## 2019-08-21 NOTE — Patient Instructions (Addendum)
Your procedure is scheduled on: 08-31-19 [facility] Report to Same Day Surgery 2nd floor medical mall [facility] Entrance-take elevator on left to 2nd floor.  Check in with surgery information desk.) To find out your arrival time please call [phone] between 1PM - 3PM on 08-28-19 FRIDAY  Remember: Instructions that are not followed completely may result in serious medical risk, up to and including death, or upon the discretion of your surgeon and anesthesiologist your surgery may need to be rescheduled.    _x___ 1. Do not eat food after midnight the night before your procedure. NO GUM OR CANDY AFTER MIDNIGHT. You may drink WATER  up to 2 hours before you are scheduled to arrive at the hospital for your procedure.  Do not drink WATER within 2 hours of your scheduled arrival to the hospital.  Type 1 and type 2 diabetics should only drink water.  _X___GATORADE G2-FINISH DRINK 2 HOURS PRIOR TO ARRIVAL TIME TO HOSPITAL DAY OF SURGERY    __x__ 2. No Alcohol for 24 hours before or after surgery.   __x__3. No Smoking or e-cigarettes for 24 prior to surgery.  Do not use any chewable tobacco products for at least 6 hour prior to surgery   ____  4. Bring all medications with you on the day of surgery if instructed.    __x__ 5. Notify your doctor if there is any change in your medical condition     (cold, fever, infections).    x___6. On the morning of surgery brush your teeth with toothpaste and water.  You may rinse your mouth with mouth wash if you wish.  Do not swallow any toothpaste or mouthwash.   Do not wear jewelry, make-up, hairpins, clips or nail polish.  Do not wear lotions, powders, or perfumes. You may wear deodorant.  Do not shave 48 hours prior to surgery. Men may shave face and neck.  Do not bring valuables to the hospital.    [facility] is not responsible for any belongings or valuables.               Contacts, dentures or bridgework may not be worn into surgery.  Leave your  suitcase in the car. After surgery it may be brought to your room.  For patients admitted to the hospital, discharge time is determined by your  treatment team.  _  Patients discharged the day of surgery will not be allowed to drive home.  You will need someone to drive you home and stay with you the night of your procedure.    Please read over the following fact sheets that you were given:   [facility] Preparing for Surgery/INCENTIVE SPIROMETER INSTRUCTIONS  _x___ TAKE THE FOLLOWING MEDICATION THE MORNING OF SURGERY WITH A SMALL SIP OF WATER. These include:  1. EFFEXOR (VENLAFAXINE)  2. YOU MAY TAKE YOUR XANAX (ALPRAZOLAM) THE MORNING OF SURGERY IF NEEDED  3.  4.  5.  6.  ____Fleets enema or Magnesium Citrate as directed.   ____ Use CHG Soap or sage wipes as directed on instruction sheet   ____ Use inhalers on the day of surgery and bring to hospital day of surgery  _X___ Stop Metformin 2 days prior to surgery-LAST DOSE ON FRIDAY July 9TH    ____ Take 1/2 of usual insulin dose the night before surgery and none on the morning surgery.   ____ Follow recommendations from Cardiologist, Pulmonologist or PCP regarding stopping Aspirin, Coumadin, Plavix ,Eliquis, Effient, or Pradaxa, and Pletal.  X____Stop Anti-inflammatories such as Advil, Aleve, Ibuprofen, Motrin, Naproxen, Naprosyn, Goodies powders or aspirin products NOW-OK to take Tylenol    _x___ Stop supplements until after surgery-STOP FISH OIL NOW-YOU MAY RESUME AFTER YOUR SURGERY   ____ Bring C-Pap to the hospital.

## 2019-08-27 ENCOUNTER — Other Ambulatory Visit: Payer: 59

## 2019-08-28 ENCOUNTER — Encounter
Admission: RE | Admit: 2019-08-28 | Discharge: 2019-08-28 | Disposition: A | Discharge status: Home / Self Care | Payer: 59 | Source: Ambulatory Visit | Attending: Obstetrics and Gynecology | Admitting: Obstetrics and Gynecology

## 2019-08-28 ENCOUNTER — Other Ambulatory Visit: Payer: Self-pay

## 2019-08-28 DIAGNOSIS — Z20822 Contact with and (suspected) exposure to covid-19: Secondary | ICD-10-CM | POA: Insufficient documentation

## 2019-08-28 DIAGNOSIS — I10 Essential (primary) hypertension: Secondary | ICD-10-CM | POA: Diagnosis not present

## 2019-08-28 DIAGNOSIS — Z01818 Encounter for other preprocedural examination: Secondary | ICD-10-CM | POA: Insufficient documentation

## 2019-08-28 LAB — CBC
HCT: 38.3 % (ref 36.0–46.0)
Hemoglobin: 13 g/dL (ref 12.0–15.0)
MCH: 28.6 pg (ref 26.0–34.0)
MCHC: 33.9 g/dL (ref 30.0–36.0)
MCV: 84.4 fL (ref 80.0–100.0)
Platelets: 383 10*3/uL (ref 150–400)
RBC: 4.54 MIL/uL (ref 3.87–5.11)
RDW: 13.4 % (ref 11.5–15.5)
WBC: 10.3 10*3/uL (ref 4.0–10.5)
nRBC: 0 % (ref 0.0–0.2)

## 2019-08-28 LAB — BASIC METABOLIC PANEL
Anion gap: 10 (ref 5–15)
BUN: 15 mg/dL (ref 6–20)
CO2: 28 mmol/L (ref 22–32)
Calcium: 9 mg/dL (ref 8.9–10.3)
Chloride: 102 mmol/L (ref 98–111)
Creatinine, Ser: 0.78 mg/dL (ref 0.44–1.00)
GFR calc Af Amer: >60 mL/min (ref >60)
GFR calc non Af Amer: >60 mL/min (ref >60)
Glucose, Bld: 190 mg/dL — ABNORMAL HIGH (ref 70–99)
Potassium: 4.2 mmol/L (ref 3.5–5.1)
Sodium: 140 mmol/L (ref 135–145)

## 2019-08-28 LAB — TYPE AND SCREEN
ABO/RH(D): O NEG
Antibody Screen: NEGATIVE

## 2019-08-29 LAB — SARS CORONAVIRUS 2 (TAT 6-24 HRS): SARS Coronavirus 2: NEGATIVE

## 2019-08-31 ENCOUNTER — Other Ambulatory Visit: Payer: Self-pay

## 2019-08-31 ENCOUNTER — Ambulatory Visit: Payer: 59 | Admitting: Anesthesiology

## 2019-08-31 ENCOUNTER — Ambulatory Visit
Admission: RE | Admit: 2019-08-31 | Discharge: 2019-08-31 | Disposition: A | Discharge status: Home / Self Care | Payer: 59 | Attending: Obstetrics and Gynecology | Admitting: Obstetrics and Gynecology

## 2019-08-31 ENCOUNTER — Encounter: Payer: Self-pay | Admitting: Obstetrics and Gynecology

## 2019-08-31 ENCOUNTER — Encounter
Admission: RE | Disposition: A | Discharge status: Home / Self Care | Payer: Self-pay | Source: Home / Self Care | Attending: Obstetrics and Gynecology

## 2019-08-31 DIAGNOSIS — Z833 Family history of diabetes mellitus: Secondary | ICD-10-CM | POA: Insufficient documentation

## 2019-08-31 DIAGNOSIS — Z79899 Other long term (current) drug therapy: Secondary | ICD-10-CM | POA: Diagnosis not present

## 2019-08-31 DIAGNOSIS — Z801 Family history of malignant neoplasm of trachea, bronchus and lung: Secondary | ICD-10-CM | POA: Insufficient documentation

## 2019-08-31 DIAGNOSIS — E119 Type 2 diabetes mellitus without complications: Secondary | ICD-10-CM | POA: Diagnosis not present

## 2019-08-31 DIAGNOSIS — Z880 Allergy status to penicillin: Secondary | ICD-10-CM | POA: Diagnosis not present

## 2019-08-31 DIAGNOSIS — F329 Major depressive disorder, single episode, unspecified: Secondary | ICD-10-CM | POA: Insufficient documentation

## 2019-08-31 DIAGNOSIS — I10 Essential (primary) hypertension: Secondary | ICD-10-CM | POA: Insufficient documentation

## 2019-08-31 DIAGNOSIS — Z9012 Acquired absence of left breast and nipple: Secondary | ICD-10-CM | POA: Diagnosis not present

## 2019-08-31 DIAGNOSIS — Z8249 Family history of ischemic heart disease and other diseases of the circulatory system: Secondary | ICD-10-CM | POA: Insufficient documentation

## 2019-08-31 DIAGNOSIS — G4730 Sleep apnea, unspecified: Secondary | ICD-10-CM | POA: Diagnosis not present

## 2019-08-31 DIAGNOSIS — E785 Hyperlipidemia, unspecified: Secondary | ICD-10-CM | POA: Diagnosis not present

## 2019-08-31 DIAGNOSIS — N950 Postmenopausal bleeding: Secondary | ICD-10-CM | POA: Insufficient documentation

## 2019-08-31 DIAGNOSIS — Z853 Personal history of malignant neoplasm of breast: Secondary | ICD-10-CM | POA: Insufficient documentation

## 2019-08-31 DIAGNOSIS — M4802 Spinal stenosis, cervical region: Secondary | ICD-10-CM | POA: Diagnosis not present

## 2019-08-31 DIAGNOSIS — Z87440 Personal history of urinary (tract) infections: Secondary | ICD-10-CM | POA: Diagnosis not present

## 2019-08-31 DIAGNOSIS — R9389 Abnormal findings on diagnostic imaging of other specified body structures: Secondary | ICD-10-CM | POA: Insufficient documentation

## 2019-08-31 DIAGNOSIS — Z7984 Long term (current) use of oral hypoglycemic drugs: Secondary | ICD-10-CM | POA: Diagnosis not present

## 2019-08-31 DIAGNOSIS — Z8262 Family history of osteoporosis: Secondary | ICD-10-CM | POA: Insufficient documentation

## 2019-08-31 DIAGNOSIS — N882 Stricture and stenosis of cervix uteri: Secondary | ICD-10-CM | POA: Diagnosis not present

## 2019-08-31 DIAGNOSIS — R87615 Unsatisfactory cytologic smear of cervix: Secondary | ICD-10-CM | POA: Diagnosis not present

## 2019-08-31 DIAGNOSIS — Z881 Allergy status to other antibiotic agents status: Secondary | ICD-10-CM | POA: Insufficient documentation

## 2019-08-31 DIAGNOSIS — R69 Illness, unspecified: Secondary | ICD-10-CM | POA: Diagnosis not present

## 2019-08-31 HISTORY — PX: HYSTEROSCOPY WITH D & C: SHX1775

## 2019-08-31 LAB — ABO/RH
ABO/RH(D): O NEG
DAT, IgG: NEGATIVE
Weak D: POSITIVE

## 2019-08-31 LAB — GLUCOSE, CAPILLARY: Glucose-Capillary: 149 mg/dL — ABNORMAL HIGH (ref 70–99)

## 2019-08-31 SURGERY — DILATATION AND CURETTAGE /HYSTEROSCOPY
Anesthesia: General | Site: Cervix

## 2019-08-31 MED ORDER — KETOROLAC TROMETHAMINE 30 MG/ML IJ SOLN
INTRAMUSCULAR | Status: AC
  Filled 2019-08-31: qty 1

## 2019-08-31 MED ORDER — FENTANYL CITRATE (PF) 100 MCG/2ML IJ SOLN
INTRAMUSCULAR | Status: AC
  Filled 2019-08-31: qty 2

## 2019-08-31 MED ORDER — IBUPROFEN 800 MG PO TABS
ORAL_TABLET | ORAL | Status: AC
  Filled 2019-08-31: qty 1

## 2019-08-31 MED ORDER — ONDANSETRON HCL 4 MG/2ML IJ SOLN: 4.0000 mg | Freq: Once | INTRAMUSCULAR | Status: DC | PRN

## 2019-08-31 MED ORDER — KETOROLAC TROMETHAMINE 30 MG/ML IJ SOLN
INTRAMUSCULAR | Status: DC | PRN
  Administered 2019-08-31: 30 mg via INTRAVENOUS

## 2019-08-31 MED ORDER — CHLORHEXIDINE GLUCONATE 0.12 % MT SOLN: 15.0000 mL | Freq: Once | OROMUCOSAL | Status: AC

## 2019-08-31 MED ORDER — POVIDONE-IODINE 10 % EX SWAB: 2.0000 "application " | Freq: Once | CUTANEOUS | Status: DC

## 2019-08-31 MED ORDER — ORAL CARE MOUTH RINSE: 15.0000 mL | Freq: Once | OROMUCOSAL | Status: AC

## 2019-08-31 MED ORDER — FENTANYL CITRATE (PF) 100 MCG/2ML IJ SOLN
25.0000 ug | INTRAMUSCULAR | Status: DC | PRN
  Administered 2019-08-31: 25 ug via INTRAVENOUS

## 2019-08-31 MED ORDER — ONDANSETRON HCL 4 MG/2ML IJ SOLN
INTRAMUSCULAR | Status: DC | PRN
  Administered 2019-08-31: 4 mg via INTRAVENOUS

## 2019-08-31 MED ORDER — IBUPROFEN 800 MG PO TABS
800.0000 mg | ORAL_TABLET | Freq: Three times a day (TID) | ORAL | Status: DC
  Administered 2019-08-31: 800 mg via ORAL

## 2019-08-31 MED ORDER — PHENYLEPHRINE HCL (PRESSORS) 10 MG/ML IV SOLN
INTRAVENOUS | Status: DC | PRN
  Administered 2019-08-31: 50 ug via INTRAVENOUS
  Administered 2019-08-31 (×4): 100 ug via INTRAVENOUS

## 2019-08-31 MED ORDER — CHLORHEXIDINE GLUCONATE 0.12 % MT SOLN
OROMUCOSAL | Status: AC
  Administered 2019-08-31: 15 mL via OROMUCOSAL
  Filled 2019-08-31: qty 15

## 2019-08-31 MED ORDER — FENTANYL CITRATE (PF) 100 MCG/2ML IJ SOLN
INTRAMUSCULAR | Status: DC | PRN
  Administered 2019-08-31 (×2): 100 ug via INTRAVENOUS

## 2019-08-31 MED ORDER — FAMOTIDINE 20 MG PO TABS
ORAL_TABLET | ORAL | Status: AC
  Administered 2019-08-31: 20 mg via ORAL
  Filled 2019-08-31: qty 1

## 2019-08-31 MED ORDER — SUGAMMADEX SODIUM 200 MG/2ML IV SOLN
INTRAVENOUS | Status: DC | PRN
  Administered 2019-08-31: 100 mg via INTRAVENOUS

## 2019-08-31 MED ORDER — LIDOCAINE HCL (CARDIAC) PF 100 MG/5ML IV SOSY
PREFILLED_SYRINGE | INTRAVENOUS | Status: DC | PRN
  Administered 2019-08-31: 100 mg via INTRAVENOUS

## 2019-08-31 MED ORDER — MIDAZOLAM HCL 2 MG/2ML IJ SOLN
INTRAMUSCULAR | Status: AC
  Filled 2019-08-31: qty 2

## 2019-08-31 MED ORDER — ROCURONIUM BROMIDE 100 MG/10ML IV SOLN
INTRAVENOUS | Status: DC | PRN
  Administered 2019-08-31: 5 mg via INTRAVENOUS

## 2019-08-31 MED ORDER — MIDAZOLAM HCL 2 MG/2ML IJ SOLN
INTRAMUSCULAR | Status: DC | PRN
  Administered 2019-08-31: 2 mg via INTRAVENOUS

## 2019-08-31 MED ORDER — SODIUM CHLORIDE 0.9 % IV SOLN: INTRAVENOUS | Status: DC

## 2019-08-31 MED ORDER — IBUPROFEN 800 MG PO TABS
800.0000 mg | ORAL_TABLET | Freq: Four times a day (QID) | ORAL | Status: DC | PRN
  Filled 2019-08-31: qty 1

## 2019-08-31 MED ORDER — PROPOFOL 10 MG/ML IV BOLUS
INTRAVENOUS | Status: AC
  Filled 2019-08-31: qty 20

## 2019-08-31 MED ORDER — LACTATED RINGERS IV SOLN: INTRAVENOUS | Status: DC

## 2019-08-31 MED ORDER — FAMOTIDINE 20 MG PO TABS: 20.0000 mg | ORAL_TABLET | Freq: Once | ORAL | Status: AC

## 2019-08-31 MED ORDER — IBUPROFEN 800 MG PO TABS
800.0000 mg | ORAL_TABLET | Freq: Three times a day (TID) | ORAL | 1 refills | Status: AC
Start: 2019-08-31 — End: 2019-09-03

## 2019-08-31 MED ORDER — SUCCINYLCHOLINE CHLORIDE 20 MG/ML IJ SOLN
INTRAMUSCULAR | Status: DC | PRN
  Administered 2019-08-31: 100 mg via INTRAVENOUS

## 2019-08-31 MED ORDER — PROPOFOL 10 MG/ML IV BOLUS
INTRAVENOUS | Status: DC | PRN
  Administered 2019-08-31: 100 mg via INTRAVENOUS

## 2019-08-31 SURGICAL SUPPLY — 26 items
BAG INFUSER PRESSURE 100CC (MISCELLANEOUS) ×3 IMPLANT
BASIN GRAD PLASTIC 32OZ STRL (MISCELLANEOUS) ×2 IMPLANT
CANISTER SUCT 3000ML PPV (MISCELLANEOUS) ×3 IMPLANT
CATH ROBINSON RED A/P 16FR (CATHETERS) ×3 IMPLANT
COVER LIGHT HANDLE STERIS (MISCELLANEOUS) ×4 IMPLANT
COVER WAND RF STERILE (DRAPES) ×3 IMPLANT
DRAPE PERI LITHO V/GYN (MISCELLANEOUS) ×2 IMPLANT
DRAPE UNDER BUTTOCK W/FLU (DRAPES) ×2 IMPLANT
ELECT REM PT RETURN 9FT ADLT (ELECTROSURGICAL) ×3
ELECTRODE REM PT RTRN 9FT ADLT (ELECTROSURGICAL) ×1 IMPLANT
GAUZE 4X4 16PLY RFD (DISPOSABLE) ×3 IMPLANT
GLOVE BIO SURGEON STRL SZ7 (GLOVE) ×3 IMPLANT
GLOVE INDICATOR 7.5 STRL GRN (GLOVE) ×3 IMPLANT
GOWN STRL REUS W/ TWL LRG LVL3 (GOWN DISPOSABLE) ×2 IMPLANT
GOWN STRL REUS W/TWL LRG LVL3 (GOWN DISPOSABLE) ×6
KIT PROCEDURE FLUENT (KITS) ×3 IMPLANT
KIT TURNOVER CYSTO (KITS) ×3 IMPLANT
PACK DNC HYST (MISCELLANEOUS) ×3 IMPLANT
PAD OB MATERNITY 4.3X12.25 (PERSONAL CARE ITEMS) ×3 IMPLANT
PAD PREP 24X41 OB/GYN DISP (PERSONAL CARE ITEMS) ×3 IMPLANT
SOL .9 NS 3000ML IRR  AL (IV SOLUTION) ×3
SOL .9 NS 3000ML IRR AL (IV SOLUTION) ×1
SOL .9 NS 3000ML IRR UROMATIC (IV SOLUTION) ×1 IMPLANT
TOWEL OR 17X26 4PK STRL BLUE (TOWEL DISPOSABLE) ×2 IMPLANT
TUBING CONNECTING 10 (TUBING) ×2 IMPLANT
TUBING CONNECTING 10' (TUBING) ×1

## 2019-08-31 NOTE — Discharge Instructions (Signed)
Discharge instructions after a hysteroscopy with dilation and curettage  Signs and Symptoms to Report  Call our office at [phone] if you have any of the following:   . Fever over 100.4 degrees or higher . Severe stomach pain not relieved with pain medications . Bright red bleeding that's heavier than a period that does not slow with rest after the first 24 hours . To go the bathroom a lot (frequency), you can't hold your urine (urgency), or it hurts when you empty your bladder (urinate) . Chest pain . Shortness of breath . Pain in the calves of your legs . Severe nausea and vomiting not relieved with anti-nausea medications . Any concerns  What You Can Expect after Surgery . You may see some pink tinged, bloody fluid. This is normal. You may also have cramping for several days.   Activities after Your Discharge Follow these guidelines to help speed your recovery at home: . Don't drive if you are in pain or taking narcotic pain medicine. You may drive when you can safely slam on the brakes, turn the wheel forcefully, and rotate your torso comfortably. This is typically 4-7 days. Practice in a parking lot or side street prior to attempting to drive regularly.  . Ask others to help with household chores for 4 weeks. . Don't do strenuous activities, exercises, or sports like vacuuming, tennis, squash, etc. until your doctor says it is safe to do so. . Walk as you feel able. Rest often since it may take a week or two for your energy level to return to normal.  . You may climb stairs . Avoid constipation:   -Eat fruits, vegetables, and whole grains. Eat small meals as your appetite will take time to return to normal.   -Drink 6 to 8 glasses of water each day unless your doctor has told you to limit your fluids.   -Use a laxative or stool softener as needed if constipation becomes a problem. You may take Miralax, metamucil, Citrucil, Colace, Senekot, FiberCon, etc. If this does not  relieve the constipation, try two tablespoons of Milk Of Magnesia every 8 hours until your bowels move.  . You may shower.  . Do not get in a hot tub, swimming pool, etc. until your doctor agrees. . Do not douche, use tampons, or have sex until your doctor says it is okay, usually about 2 weeks. . Take your pain medicine when you need it. The medicine may not work as well if the pain is bad.  Take the medicines you were taking before surgery. Other medications you might need are pain medications (ibuprofen), medications for constipation (Colace) and nausea medications (Zofran).   AMBULATORY SURGERY  DISCHARGE INSTRUCTIONS   1) The drugs that you were given will stay in your system until tomorrow so for the next 24 hours you should not:  A) Drive an automobile B) Make any legal decisions C) Drink any alcoholic beverage   2) You may resume regular meals tomorrow.  Today it is better to start with liquids and gradually work up to solid foods.  You may eat anything you prefer, but it is better to start with liquids, then soup and crackers, and gradually work up to solid foods.   3) Please notify your doctor immediately if you have any unusual bleeding, trouble breathing, redness and pain at the surgery site, drainage, fever, or pain not relieved by medication.   Please contact your physician with any problems or Same Day Surgery  at [phone], Monday through Friday 6 am to 4 pm, or [place] at [facility] number at [phone].

## 2019-08-31 NOTE — Op Note (Signed)
Operative Report Hysteroscopy with Dilation and Curettage   Indications: Postmenopausal bleeding, cervical stenosis   Pre-operative Diagnosis: Postmenopausal bleeding, cervical stenosis    Post-operative Diagnosis: same.  Procedure: 1. Exam under anesthesia 2. Fractional D&C 3. Hysteroscopy 4. Pap smear  Surgeon: Benjaman Kindler, MD  Assistant(s):  None  Anesthesia: General LMA anesthesia  Anesthesiologist: Alvin Critchley, MD Anesthesiologist: Alvin Critchley, MD CRNA: Genevie Ann, CRNA; Rosebud Poles C, CRNA  Estimated Blood Loss:  Minimal         Intraoperative medications: n/a         Total IV Fluids: 810ml  Urine Output: 86ml  Total Fluid Deficit:  300 mL          Specimens: Endocervical curettings. Endometrial curettings were unable to be obtained         Complications:  None; patient tolerated the procedure well.         Disposition: PACU - hemodynamically stable.         Condition: stable  Findings: Uterus unable to be measured by sound; normal cervix, vagina, perineum. Cervical stenosis and no endocervical canal was visible beyond 2cm. A false passage was made at about 3cm, and no endometrial tissue identified visually. The sound did not go beyond 4cm. No evidence of malignancy.  Indication for procedure/Consents: 59 y.o. F  here for scheduled surgery for the aforementioned diagnoses.    Risks of surgery were discussed with the patient including but not limited to: bleeding which may require transfusion; infection which may require antibiotics; injury to uterus or surrounding organs; intrauterine scarring which may impair future fertility; need for additional procedures including laparotomy or laparoscopy; and other postoperative/anesthesia complications. Written informed consent was obtained.    Procedure Details:  Fractional D&C only  The patient was taken to the operating room where anesthesia was administered and was found to be adequate.  After a formal and adequate timeout was performed, she was placed in the dorsal lithotomy position and examined with the above findings. She was then prepped and draped in the sterile manner. Her bladder was catheterized for an estimated amount of clear, yellow urine. A weighed speculum was then placed in the patient's vagina and a single tooth tenaculum was applied to the anterior lip of the cervix. The sound did not pass easily, and the hysteroscope was introduced. A hysteroscopic grasper was used to probe along the internal cervix walls, without success. A small space appeared to open anteriorly, but with dilation this was a false passage, without any opening into the endometrium.   Her cervix was serially dilated to 15 [place] using Hanks dilators. An ECC was performed. The hysteroscope was introduced to reveal the above findings.   No endometrial sample was able to be taken. The tenaculum was removed from the anterior lip of the cervix and the vaginal speculum with good hemostasis.   The patient tolerated the procedure well and was taken to the recovery area awake and in stable condition. She received iv acetaminophen and Toradol prior to leaving the OR.  The patient will be discharged to home as per PACU criteria. Routine postoperative instructions given. She was prescribed Ibuprofen and Colace. She will follow up in the clinic in two weeks for postoperative evaluation.

## 2019-08-31 NOTE — OR Nursing (Signed)
Incentive spirometer given to patient, instructed with return demonstration.

## 2019-08-31 NOTE — Interval H&P Note (Signed)
History and Physical Interval Note:  08/31/2019 11:06 AM  Sheena Ruiz  has presented today for surgery, with the diagnosis of post menopausal bleeding, endometrial thickening.  The various methods of treatment have been discussed with the patient and family. After consideration of risks, benefits and other options for treatment, the patient has consented to  Procedure(s): DILATATION AND CURETTAGE /HYSTEROSCOPY (N/A) and pap smear as a surgical intervention.  The patient's history has been reviewed, patient examined, no change in status, stable for surgery.  I have reviewed the patient's chart and labs.  Questions were answered to the patient's satisfaction.     Benjaman Kindler

## 2019-08-31 NOTE — Anesthesia Preprocedure Evaluation (Addendum)
Anesthesia Evaluation  Patient identified by MRN, date of birth, ID band Patient awake    Reviewed: Allergy & Precautions, NPO status , Patient's Chart, lab work & pertinent test results  History of Anesthesia Complications (+) history of anesthetic complications  Airway Mallampati: III  TM Distance: <3 FB     Dental  (+) Caps, Teeth Intact   Pulmonary sleep apnea and Continuous Positive Airway Pressure Ventilation ,    Pulmonary exam normal        Cardiovascular hypertension, Normal cardiovascular exam     Neuro/Psych  Headaches, PSYCHIATRIC DISORDERS Depression    GI/Hepatic Neg liver ROS, hiatal hernia, GERD  ,  Endo/Other  diabetes  Renal/GU negative Renal ROS  Female GU complaint     Musculoskeletal  (+) Arthritis , Osteoarthritis,    Abdominal Normal abdominal exam  (+)   Peds negative pediatric ROS (+)  Hematology negative hematology ROS (+)   Anesthesia Other Findings   Reproductive/Obstetrics                            Anesthesia Physical Anesthesia Plan  ASA: III  Anesthesia Plan: General   Post-op Pain Management:    Induction: Intravenous  PONV Risk Score and Plan:   Airway Management Planned: Oral ETT and Video Laryngoscope Planned  Additional Equipment:   Intra-op Plan:   Post-operative Plan: Extubation in OR  Informed Consent: I have reviewed the patients History and Physical, chart, labs and discussed the procedure including the risks, benefits and alternatives for the proposed anesthesia with the patient or authorized representative who has indicated his/her understanding and acceptance.     Dental advisory given  Plan Discussed with: CRNA and Surgeon  Anesthesia Plan Comments:        Anesthesia Quick Evaluation

## 2019-08-31 NOTE — Transfer of Care (Signed)
Immediate Anesthesia Transfer of Care Note  Patient: Camia Dipinto  Procedure(s) Performed: DILATATION AND CURETTAGE /HYSTEROSCOPY (N/A Cervix)  Patient Location: PACU  Anesthesia Type:General  Level of Consciousness: awake, alert  and sedated  Airway & Oxygen Therapy: Patient Spontanous Breathing and Patient connected to face mask oxygen  Post-op Assessment: Report given to RN and Post -op Vital signs reviewed and stable  Post vital signs: Reviewed and stable  Last Vitals:  Vitals Value Taken Time  BP 138/72 08/31/19 1248  Temp    Pulse 95 08/31/19 1252  Resp 19 08/31/19 1252  SpO2 100 % 08/31/19 1252  Vitals shown include unvalidated device data.  Last Pain:  Vitals:   08/31/19 1008  TempSrc: Temporal  PainSc: 0-No pain         Complications: No complications documented.

## 2019-09-01 ENCOUNTER — Encounter: Payer: Self-pay | Admitting: Obstetrics and Gynecology

## 2019-09-01 LAB — SURGICAL PATHOLOGY

## 2019-09-01 NOTE — Anesthesia Postprocedure Evaluation (Signed)
Anesthesia Post Note  Patient: Sheena Ruiz  Procedure(s) Performed: DILATATION AND CURETTAGE /HYSTEROSCOPY (N/A Cervix)  Patient location during evaluation: PACU Anesthesia Type: General Level of consciousness: awake and alert and oriented Pain management: pain level controlled Vital Signs Assessment: post-procedure vital signs reviewed and stable Respiratory status: spontaneous breathing Cardiovascular status: blood pressure returned to baseline Anesthetic complications: no   No complications documented.   Last Vitals:  Vitals:   08/31/19 1403 08/31/19 1420  BP: 100/62 115/61  Pulse: 79 83  Resp: 10 16  Temp: 36.6 C 36.6 C  SpO2: 97% 96%    Last Pain:  Vitals:   08/31/19 1420  TempSrc: Temporal  PainSc: 3                  Decklyn Hyder

## 2019-09-08 ENCOUNTER — Other Ambulatory Visit: Payer: Self-pay | Admitting: Family Medicine

## 2019-09-21 ENCOUNTER — Other Ambulatory Visit: Payer: Self-pay | Admitting: Surgery

## 2019-09-21 DIAGNOSIS — M7521 Bicipital tendinitis, right shoulder: Secondary | ICD-10-CM

## 2019-09-21 DIAGNOSIS — M7581 Other shoulder lesions, right shoulder: Secondary | ICD-10-CM | POA: Diagnosis not present

## 2019-09-21 DIAGNOSIS — M7582 Other shoulder lesions, left shoulder: Secondary | ICD-10-CM | POA: Diagnosis not present

## 2019-09-22 ENCOUNTER — Encounter: Payer: Self-pay | Admitting: Family Medicine

## 2019-09-22 MED ORDER — ALPRAZOLAM 0.5 MG PO TABS: ORAL_TABLET | ORAL | 2 refills | Status: DC

## 2019-09-22 NOTE — Telephone Encounter (Signed)
Ok to refill??  Last office visit 04/29/2019.  Last refill 04/08/2019, #2 refills.

## 2019-09-28 ENCOUNTER — Ambulatory Visit: Payer: 59

## 2019-09-30 ENCOUNTER — Other Ambulatory Visit: Payer: Self-pay | Admitting: Surgery

## 2019-09-30 DIAGNOSIS — M7521 Bicipital tendinitis, right shoulder: Secondary | ICD-10-CM

## 2019-10-14 DIAGNOSIS — Z96661 Presence of right artificial ankle joint: Secondary | ICD-10-CM | POA: Diagnosis not present

## 2019-10-14 DIAGNOSIS — M19071 Primary osteoarthritis, right ankle and foot: Secondary | ICD-10-CM | POA: Diagnosis not present

## 2019-10-14 DIAGNOSIS — M7731 Calcaneal spur, right foot: Secondary | ICD-10-CM | POA: Diagnosis not present

## 2019-10-27 ENCOUNTER — Ambulatory Visit
Admission: RE | Admit: 2019-10-27 | Discharge: 2019-10-27 | Disposition: A | Discharge status: Home / Self Care | Payer: 59 | Source: Ambulatory Visit | Attending: Surgery | Admitting: Surgery

## 2019-10-27 DIAGNOSIS — M7521 Bicipital tendinitis, right shoulder: Secondary | ICD-10-CM

## 2019-10-27 DIAGNOSIS — M19011 Primary osteoarthritis, right shoulder: Secondary | ICD-10-CM | POA: Diagnosis not present

## 2019-10-30 ENCOUNTER — Encounter: Payer: Self-pay | Admitting: Family Medicine

## 2019-10-30 DIAGNOSIS — Z03818 Encounter for observation for suspected exposure to other biological agents ruled out: Secondary | ICD-10-CM | POA: Diagnosis not present

## 2019-10-30 DIAGNOSIS — Z1152 Encounter for screening for COVID-19: Secondary | ICD-10-CM | POA: Diagnosis not present

## 2019-10-30 DIAGNOSIS — E119 Type 2 diabetes mellitus without complications: Secondary | ICD-10-CM

## 2019-11-09 DIAGNOSIS — J019 Acute sinusitis, unspecified: Secondary | ICD-10-CM | POA: Diagnosis not present

## 2019-11-09 DIAGNOSIS — J323 Chronic sphenoidal sinusitis: Secondary | ICD-10-CM | POA: Diagnosis not present

## 2019-11-30 DIAGNOSIS — M7582 Other shoulder lesions, left shoulder: Secondary | ICD-10-CM | POA: Diagnosis not present

## 2019-11-30 DIAGNOSIS — M7581 Other shoulder lesions, right shoulder: Secondary | ICD-10-CM | POA: Diagnosis not present

## 2019-11-30 DIAGNOSIS — M7521 Bicipital tendinitis, right shoulder: Secondary | ICD-10-CM | POA: Diagnosis not present

## 2019-11-30 DIAGNOSIS — G5601 Carpal tunnel syndrome, right upper limb: Secondary | ICD-10-CM | POA: Diagnosis not present

## 2019-12-09 ENCOUNTER — Encounter: Payer: Self-pay | Admitting: Family Medicine

## 2019-12-09 DIAGNOSIS — E119 Type 2 diabetes mellitus without complications: Secondary | ICD-10-CM

## 2019-12-11 DIAGNOSIS — E119 Type 2 diabetes mellitus without complications: Secondary | ICD-10-CM | POA: Diagnosis not present

## 2019-12-11 DIAGNOSIS — E669 Obesity, unspecified: Secondary | ICD-10-CM | POA: Diagnosis not present

## 2019-12-11 DIAGNOSIS — E782 Mixed hyperlipidemia: Secondary | ICD-10-CM | POA: Diagnosis not present

## 2019-12-11 MED ORDER — GLIPIZIDE 5 MG PO TABS: ORAL_TABLET | ORAL | 1 refills | Status: AC

## 2019-12-11 NOTE — Addendum Note (Signed)
Addended by: York Cerise C on: 12/11/2019 10:07 AM   Modules accepted: Orders

## 2019-12-13 ENCOUNTER — Encounter: Payer: Self-pay | Admitting: Family Medicine

## 2019-12-16 ENCOUNTER — Other Ambulatory Visit: Payer: Self-pay | Admitting: Surgery

## 2019-12-21 ENCOUNTER — Inpatient Hospital Stay: Admission: RE | Admit: 2019-12-21 | Payer: 59 | Source: Ambulatory Visit

## 2019-12-22 ENCOUNTER — Other Ambulatory Visit: Payer: Self-pay

## 2019-12-22 ENCOUNTER — Other Ambulatory Visit
Admission: RE | Admit: 2019-12-22 | Discharge: 2019-12-22 | Disposition: A | Discharge status: Home / Self Care | Payer: 59 | Source: Ambulatory Visit | Attending: Surgery | Admitting: Surgery

## 2019-12-22 ENCOUNTER — Encounter
Admission: RE | Admit: 2019-12-22 | Discharge: 2019-12-22 | Disposition: A | Discharge status: Home / Self Care | Payer: 59 | Source: Ambulatory Visit | Attending: Surgery | Admitting: Surgery

## 2019-12-22 DIAGNOSIS — Z01812 Encounter for preprocedural laboratory examination: Secondary | ICD-10-CM | POA: Diagnosis not present

## 2019-12-22 DIAGNOSIS — Z20822 Contact with and (suspected) exposure to covid-19: Secondary | ICD-10-CM | POA: Insufficient documentation

## 2019-12-22 LAB — SARS CORONAVIRUS 2 (TAT 6-24 HRS): SARS Coronavirus 2: NEGATIVE

## 2019-12-22 NOTE — Pre-Procedure Instructions (Signed)
Progress Notes - documented in this encounter  Drema Halon, NP - 12/11/2019 11:15 AM EDT Formatting of this note is different from the original. HPI: Sheena Ruiz is seen today for evaluation of type 2 Diabetes. She is a 59 y.o. female who was diagnosed with diabetes approximately 10 years ago. She cannot recall how long she has been on medication to treat. She presents to clinic today secondary to concern regarding her blood sugar management.  Current diabetes medications: Metformin 500 mg 4 pills every morning (ran out 4 days ago), Glipizide 5 mg twice a day. Patient reports that she often takes 2 additional tablets for a total of 30 mg daily- she reports she takes this differently than prescribed as it keeps her blood sugars in check, but she frequently runs out. She reports she lives between two places as she takes care of her disabled brother and sometimes leaves her medication behind and loses it.   Previous medications: Trulicity, Jardiance-did not tolerate due to chronic yeast infections. She desires to avoid insulin therapy.   She is not testing her blood sugars as she reports she ran out of test strips about 3 weeks ago. She gives verbal report that her blood sugars were running between 105-140 depending on if she does or does not eat a bedtime snack. She reports an increase in blood sugar if she has a bedtime snack. She does not check a bedtime blood sugar.  She does not follow a structured diet. Reports an unpredictable schedule and therefore good eating habits are challenging, more fast food than home cooked meals. She has started an exercise regime, walks and has indoor equipment she uses when she is at her home 3 times per day for 20 minutes prior to each meal. She has tried the [facility] Education program but has not been able to complete the series due to her traveling schedule.   ROS: Standard 10 system review of systems positive for Depression,  right Shoulder pain, Restless Leg Syndrome, Ankle pain.   Medical History: Past Medical History:  Diagnosis Date  . Anesthesia complication  fast heart rate only in 2001  . Depression  . Diabetes mellitus type 2, uncomplicated (CMS-HCC)  . Hyperlipidemia  . Hypertension  . Malignant neoplasm of upper-inner quadrant of left breast in female, estrogen receptor positive (CMS-HCC) 03/05/2018   Surgical History: Past Surgical History:  Procedure Laterality Date  . BIOPSY/EXCISION LYMPH NODE AXILLARY Left 05/13/2018  Procedure: BIOPSY OR EXCISION OF LYMPH NODE(S); OPEN, DEEP AXILLARY NODE(S); Surgeon: Johnna Acosta, MD; Location: [facility] OR; Service: General Surgery; Laterality: Left;  . CESAREAN SECTION 2003  . CHOLECYSTECTOMY 1997  . COLONOSCOPY 09/19/2011, 12/27/2006  Adenomatous Polyp, FHCC (Father), FH Colon Polyps (Father): CBF 08/2016; Recall Ltr mailed 08/03/2016 (dw)  . COLONOSCOPY 04/22/2018  Adenomatous Polyp; [place] (Father) CBF 04/2023  . foreign body removal Right  . MASTECTOMY PARTIAL Left 05/13/2018  Procedure: MASTECTOMY, PARTIAL [place]; Surgeon: Johnna Acosta, MD; Location: [facility] OR; Service: General Surgery; Laterality: Left;  . Nasal Surgery 2005  fungal mass removed; deviated septum  . OTHER SURGICAL HISTORY 2001  ankle surgery  . PERCUTANEOUS BIOPSY BREAST Left 03/21/2018  03/21/2018: Left breast US-guided core needle biopsy of the mass at 11:30 (ribbon clip placed/verified) - invasive ductal carcinoma, grade 1, ER+/PR+/HER2-  . TOTAL ANKLE REPLACEMENT 2012  . TUBAL LIGATION   Social History: reports that she has never smoked. She has never used smokeless tobacco. She reports current alcohol use.  She reports that she does not use drugs.  Family History: family history includes Allergic rhinitis in her maternal grandfather and maternal grandmother; Asthma in her maternal grandfather and maternal grandmother; Breast cancer in her cousin, cousin, and paternal aunt;  Cervical cancer in her mother; Colon cancer in her father, maternal uncle, and paternal aunt; Colon polyps in her father; Depression in her brother, father, maternal grandfather, maternal grandmother, mother, paternal grandfather, and paternal grandmother; Diabetes type II in her maternal grandmother and paternal grandmother; High blood pressure (Hypertension) in her brother, father, maternal grandfather, maternal grandmother, mother, paternal grandfather, paternal grandmother, and sister; Liver cancer in her maternal uncle; Lung cancer in her maternal uncle and maternal uncle; Myocardial Infarction (Heart attack) in her maternal grandfather, paternal grandfather, and paternal grandmother; Osteoporosis (Thinning of bones) in her maternal grandmother, mother, and paternal grandmother; Ovarian cancer in her paternal aunt; Prostate cancer in her father; Stroke in her father, maternal grandmother, and paternal grandmother; Uterine cancer in her mother.  Medications: Current Outpatient Medications  Medication Sig Dispense Refill Last Dose  . acetaminophen (TYLENOL) 500 MG tablet Take 1,000 mg by mouth as needed for Pain Taking  . ALPRAZolam (XANAX) 0.5 MG tablet Take 1 tablet (0.5 mg total) by mouth as needed for Anxiety (take 1-2 tablets 30-45 min prior to MRI) for up to 2 doses 2 tablet 0 Taking  . EFFEXOR XR 150 mg XR capsule TAKE 1 CAPSULE BY MOUTH ONCE DAILY (Patient taking differently: Take 150 mg by mouth once daily ) 30 capsule 5 Taking  . estradioL (ESTRACE) 0.01 % (0.1 mg/gram) vaginal cream Insert pea sized amount vaginally nightly x 4 weeks prior to surgery. 30 g 2 Taking  . glipiZIDE (GLUCOTROL) 5 MG tablet Take 5 mg by mouth 2 (two) times daily before meals Taking  . metFORMIN (GLUCOPHAGE-XR) 500 MG XR tablet TAKE 4 TABLETS BY MOUTH EVERY MORNING WITH BREAKFAST Taking  . multivitamin tablet Take 1 tablet by mouth every morning Taking  . naproxen sodium (ALEVE) 220 MG tablet Take 440 mg by mouth  as needed for Pain Taking  . olmesartan-hydrochlorothiazide (BENICAR HCT) 20-12.5 mg tablet TAKE 1 TABLET BY MOUTH ONCE DAILY (Patient taking differently: Take 1 tablet by mouth every morning ) 30 tablet 5 Taking   Allergies: Allergies  Allergen Reactions  . Doxycycline Rash  . Dulaglutide Abdominal Pain, Diarrhea and Nausea  GI Upset  . Empagliflozin Other (See Comments)  Yeast infections  . Fenofibrate Micronized Rash  "felt bad and heavy."  . Penicillins Rash  Pt has tolerated oral Cephalosporins in past   Physical Exam: Vitals:  12/11/19 1129  BP: 124/72  Patient Position: Sitting  BP Cuff Size: Adult  Pulse: 84  SpO2: 97%  Weight: 90.7 kg (200 lb)  Height: 161 cm (5' 3.39")   Body mass index is 34.99 kg/m.  GENERAL: Pleasant, well-appearing female in no distress.  HEENT: Pupils equal and round. Extraocular movements intact. No lid lag or proptosis. Mucous membranes moist LYMPH: No cervical, submandibular, supraclavicular lymphadenopathy CARDIOVASCULAR: Normal rate and regular rhythm with no murmurs, rubs or gallops. RESPIRATORY: Clear to auscultation bilaterally with normal respiratory effort.  GASTROINTESTINAL: Soft, nontender, and nondistended. Normoactive bowel sounds. No masses EXTREMITIES: Warm and well-perfused. No clubbing, cyanosis, or edema. No noted tremor. NEUROLOGIC: Gait normal.  SKIN: Warm and dry with no rashes or lesions.  PSYCH: Alert and oriented x 3. Mood is normal  Labs: Lab Results  Component Value Date  HGBA1C 7.2 (H) 12/11/2019  HGBA1C 10.4 (H) 93/26/7124   Basic metabolic panel Specimen: Blood Ref Range & Units 08/28/2019  Sodium 135 - 145 mmol/L 140  Potassium 3.5 - 5.1 mmol/L 4.2  Chloride 98 - 111 mmol/L 102  CO2 22 - 32 mmol/L 28  Glucose, Bld 70 - 99 mg/dL 190High  BUN 6 - 20 mg/dL 15  Creatinine, Ser 0.44 - 1.00 mg/dL 0.78  Calcium 8.9 - 10.3 mg/dL 9.0  GFR calc non Af Amer >60 mL/min >60  GFR calc Af Amer >60 mL/min >60   Anion gap 5 - 15 10   Prophylaxis. . She reports she last saw the eye doctor less than 2 years ago and has one every 2 years. Last seen at [facility].   Assessment/Plan: Diabetes -  - We discussed diabetes, its complications, and goals of care. Patient was advised of the importance of diet and behavioral changes to improve glycemic control.  - Diabetes is moderately controlled.  - I will adjust medication regimen as follows: - Metformin XR 500 4 pills nightly- She will call for a prescription if she cannot find her current bottle. (Advised her she can get at [facility] for $4) - Glipizide 5 mg 2 pills (65m) 2 - 3 times per day max 30 mg daily. (Patient prefers dosing in 5 mg as she has been taking this for years and is reluctant to change to 10 mg pills). Will refill glipizide to provide 30 mg total daily dosing  Patient was advised of the importance of regular monitoring of blood sugars.  - Instructed to monitor blood sugars 2-3 times per day - Reminded to bring blood sugar log and/or meter to every visit  Obesity -  Patient was advised of the importance of a low carbohydrate diet. Patient was advised of the importance of achieving and maintaining a healthy body weight. She was advised to lose weight and is encouraged to follow a low carbohydrate diet with intake of lean proteins, supplemental vegetables and low-fat food choices. The importance of portion control is emphasized. Patient is encouraged to participate in regular exercise activity, striving for 30 minutes of sustained exercise activity 4-5 times weekly.  Hyperlipidemia-no lipid panel available for review today. Previously on fenofibrate, which she reportedly did not tolerate. Will obtain visit. Patient encouraged to follow a low-fat diet and increase exercise activity.   Hypertension- blood pressure is well controlled on Benicar/HCTZ.   Return to clinic in 2-3 months-Labs prior- A1C, CMP, Lipid, Microalbumin  This note was  partially prepared by PEtheleen Mayhew LPN acting as a scribe for HMalissa Hippo NP.     Electronically signed by BDrema Halon NP at 12/12/2019 10:25 AM EDT  Plan of Treatment - documented as of this encounter Upcoming Encounters Upcoming Encounters  Date Type Specialty Care Team Description  01/04/2020 Post Op O[place]enn [place] J[place] [id number] [phone](Work)  [phone](Fax)    02/08/2020 [facility] Poggi, JSmith Mince MD  [facility] [place] [id number] [phone](Work)  [phone](Fax)    03/07/2020 Ancillary Orders Lab OToni Arthurs MD  [address] [address] [phone](Work)  [phone](Fax)    03/11/2020 Appointment Radiology    03/11/2020 Office Visit Oncology PJohnna Acosta MD  [address] [place] [address] [phone](Work)  [phone](Fax)    03/18/2020 Office Visit Endocrinology  Toni Arthurs, [place]  [place], [place] [address]  [phone] (Work)  [phone] ([address])    Pasty Arch, Patsey Berthold, NP  [place], [place] [address]  [phone] (Work)  [phone] (Fax)     Scheduled Orders Scheduled Orders  Name Type Priority Associated Diagnoses Order Schedule  Hemoglobin A1C Lab Routine Type 2 diabetes mellitus without complication, unspecified whether long term insulin use (CMS-HCC)  Expected: 03/10/2020 (Approximate), Expires: 10/01/2021  Lipid Panel w/calc LDL Lab Routine Type 2 diabetes mellitus without complication, unspecified whether long term insulin use (CMS-HCC)  Hyperlipidemia, mixed  Expected: 03/10/2020 (Approximate), Expires: 10/01/2021  Microalbumin/Creatinine Ratio, Random Urine Lab Routine Type 2 diabetes mellitus without complication, unspecified whether  long term insulin use (CMS-HCC)  Expected: 03/10/2020 (Approximate), Expires: 10/01/2021  Comprehensive Metabolic [facility]) Lab Routine Type 2 diabetes mellitus without complication, unspecified whether long term insulin use (CMS-HCC)  Expected: 03/10/2020 (Approximate), Expires: 12/05/2020  Procedures - documented in this encounter Procedure Name Priority Date/Time Associated Diagnosis Comments  POC-A1C- [facility] [place] STAT 12/11/2019 11:34 AM EDT Type 2 diabetes mellitus without complication, unspecified whether long term insulin use (CMS-HCC)  Results for this procedure are in the results section.   Lab Results - documented in this encounter  POC-A1C-[facility] [place] (12/11/2019 11:34 AM EDT) Joellyn Quails [facility] [place] (12/11/2019 11:34 AM EDT)  Component Value Ref Range Performed At Pathologist Signature  Hemoglobin A1C 7.2 (H) 4.2 - 5.6 % [facility] - LAB   Average Blood Glucose (Calc) 160 mg/dL [facility] - LAB    POC-A1C-[facility] (12/11/2019 11:34 AM EDT)  Specimen  Blood   POC-A1C-[facility] [place] (12/11/2019 11:34 AM EDT)  Narrative  [place] - LAB - 12/11/2019 12:07 PM EDT  Normal Range:  4.2 - 5.6% Increased Risk: 5.7 - 6.4% Diabetes:    >= 6.5% Glycemic Control for adults with diabetes: <7%     POC-A1C-[facility] [place] (12/11/2019 11:34 AM EDT)  Performing Organization Address City/State/ZIP Code Phone Number  [place]  [address]  [place], [place] [address]      Visit Diagnoses - documented in this encounter Diagnosis  Type 2 diabetes mellitus without complication, unspecified whether long term insulin use (CMS-HCC) - Primary   Hyperlipidemia, mixed  Mixed hyperlipidemia   Obesity (BMI 30-39.9), unspecified   Discontinued Medications - documented as of this encounter Medication Sig Discontinue Reason Start Date End Date   glipiZIDE (GLUCOTROL) 5 MG tablet  Take 5 mg by mouth 2 (two) times daily before meals  Patient Transfer 04/16/2019 12/11/2019  glipiZIDE (GLUCOTROL XL) 5 MG XL tablet  Take 10 mg ( 2 tablets) 2-3 times daily, max 30 units daily  12/11/2019 12/11/2019  Care Teams - documented as of this encounter Team Member Relationship Specialty Start Date End Date  Med[facility], [facility]  [address] [place], [place] [address]  [phone] (Work)  [phone] (Fax)  PCP - General  04/16/18   Images Patient Demographics  Patient Address Communication Language Race / Ethnicity Marital Status  [address] [facility]) [place], [place] [address]  Former (Feb. 14, 2020 - Mar. 03, 2020): [address] [place]) [place], [place] [address]  Former (Sep. 05, 2018 - Feb. 13, 2020): [address]) [place], [place] [address] [phone] (Mobile) [phone] (Home) [email or web address] English (Preferred) White / Not Hispanic or Latino Married  Patient Contacts  Contact  Name Contact Address Communication Relationship to Patient  Hadleigh Felber [address] [place], [place] [address] [phone] [facility]) [place]_[email or web address] .[place], Emergency [place] [address] [place], [place] [address] [phone] [facility]) [email or web address] Son or Daughter, Emergency Contact  Document Information  Primary Care Provider Other Service Providers Document Coverage Dates  [place] (Feb. 26, 2020February 26, 2020 - Present) [phone] (Work) [phone] (Fax) [place], [place] [address]    Oct. 22, 2021October 22, 2021 - Oct. 23, 2021October 23, 2021   [place] [address] [place], [place] [address]   Encounter Providers Encounter Date  Drema Halon, NP (Attending) [phone] (Work) [phone] (Fax) [place],  [place] [address] Endocrinology Oct. 22, 2021October 22, 2021   Legal Authenticator   Daneil Dan    Show All Sections

## 2019-12-22 NOTE — Progress Notes (Signed)
[facility] Perioperative Services: Pre-Admission/Anesthesia Testing   Date: 12/22/19 Name: Sheena Ruiz MRN:   [id number]  Re: Consideration of preoperative prophylactic antibiotic change   Request sent to: Poggi, Marshall Cork, MD (routed and/or faxed via [facility])  Planned Surgical Procedure(s):    Case: [id number] Date/Time: 12/24/19 1020   Procedure: CARPAL TUNNEL RELEASE ENDOSCOPIC (Right Wrist)   Anesthesia type: Choice   Pre-op diagnosis: CARPAL TUNNEL SYNDROME, RIGHT   Location: [place] 03 / [place] [facility]   Surgeons: Corky Mull, MD    Notes: 1. Patient has a documented allergy to PCN  . Advising that PCN has caused her to experience low severity rash in the past.   2. Received cephalosporin with no documented complications  CEFAZOLIN received on 05/13/2018  CEPHALEXIN received on 03/14/2018  3. Screened as appropriate for cephalosporin use during medication reconciliation . No immediate angioedema, dysphagia, SOB, anaphylaxis symptoms. . No severe rash involving mucous membranes or skin necrosis. . No hospital admissions related to side effects of PCN/cephalosporin use.  . No documented reaction to PCN or cephalosporin in the last 10 years.  Request:  As an evidence based approach to reducing the rate of incidence for post-operative SSI and the development of MDROs, could an agent with narrower coverage for preoperative prophylaxis in this patient's upcoming surgical course be considered?  1. Currently ordered preoperative prophylactic ABX: clindamycin.   2. Specifically requesting change to cephalosporin (CEFAZOLIN).   3. Please communicate decision with me and I will change the orders in Epic as per your direction.   Things to consider:  Many patients report that they were "allergic" to PCN earlier in life, however this does not translate into a true lifelong allergy. Patients can lose sensitivity to specific IgE  antibodies over time if PCN is avoided (Kleris & Lugar, 2019).   Up to 10% of the adult population and 15% of hospitalized patients report an allergy to PCN, however clinical studies suggest that 90% of those reporting an allergy can tolerate PCN antibiotics (Kleris & Lugar, 2019).   Cross-sensitivity between PCN and cephalosporins has been documented as being as high as 10%, however this estimation included data believed to have been collected in a setting where there was contamination. Newer data suggests that the prevalence of cross-sensitivity between PCN and cephalosporins is actually estimated to be closer to 1% (Hermanides et al., 2018).    Patients labeled as PCN allergic, whether they are truly allergic or not, have been found to have inferior outcomes in terms of rates of serious infection, and these patients tend to have longer hospital stays (Monroe, 2019).   Treatment related secondary infections, such as Clostridioides difficile, have been linked to the improper use of broad spectrum antibiotics in patients improperly labeled as PCN allergic (Kleris & Lugar, 2019).   Anaphylaxis from cephalosporins is rare and the evidence suggests that there is no increased risk of an anaphylactic type reaction when cephalosporins are used in a PCN allergic patient (Pichichero, 2006).  Citations: Hermanides J, Lemkes BA, Prins Pearla Dubonnet MW, Terreehorst I. Presumed ?-Lactam Allergy and Cross-reactivity in the Operating Theater: A Practical Approach. Anesthesiology. 2018 Aug;129(2):335-342. doi: 10.1097/ALN.0000000000002252. PMID: 10315945.  Kleris, [place]., & Lugar, P. L. (2019). Things We Do For No Reason: Failing to Question a Penicillin Allergy History. Journal of hospital medicine, 14(10), [phone]. Advance online publication. https://www.wallace-middleton.info/  Pichichero, M. E. (2006). Cephalosporins can be prescribed safely for penicillin-allergic patients. Journal of  family medicine,  55(2), 106-112. Accessed: https://cdn.mdedge.com/files/s69fs-public/Document/September-2017/5502JFP_AppliedEvidence1.pdf   Honor Loh, MSN, APRN, FNP-C, CEN [facility]  Peri-operative Services Nurse Practitioner 12/22/19 11:31 AM

## 2019-12-22 NOTE — Patient Instructions (Signed)
Your procedure is scheduled on:12-24-19 THURSDAY Report to Day Surgery on the 2nd floor of the [facility]. To find out your arrival time, please call [phone] between 1PM - 3PM on:12-23-19 WEDNESDAY  REMEMBER: Instructions that are not followed completely may result in serious medical risk, up to and including death; or upon the discretion of your surgeon and anesthesiologist your surgery may need to be rescheduled.  Do not eat food after midnight the night before surgery.  No gum chewing, lozengers or hard candies.  You may however, drink WATER up to 2 hours before you are scheduled to arrive for your surgery. Do not drink anything within 2 hours of your scheduled arrival time.  Type 1 and Type 2 diabetics should only drink water.  TAKE THESE MEDICATIONS THE MORNING OF SURGERY WITH A SIP OF WATER: -EFFEXOR   Stop Metformin 2 days prior to surgery-LAST DOSE NOW 12-22-19 (PT HAD HER 12-22-19 AM DOSE ALREADY)  One week prior to surgery: Stop Anti-inflammatories (NSAIDS) such as Advil, Aleve, Ibuprofen, Motrin, Naproxen, Naprosyn and Aspirin based products such as Excedrin, Goodys Powder, BC Powder-OK TO TAKE TYLENOL IF NEEDED  Stop ANY OVER THE COUNTER supplements until after surgery. (You may continue taking multivitamin.)  No Alcohol for 24 hours before or after surgery.  No Smoking including e-cigarettes for 24 hours prior to surgery.  No chewable tobacco products for at least 6 hours prior to surgery.  No nicotine patches on the day of surgery.  Do not use any "recreational" drugs for at least a week prior to your surgery.  Please be advised that the combination of cocaine and anesthesia may have negative outcomes, up to and including death. If you test positive for cocaine, your surgery will be cancelled.  On the morning of surgery brush your teeth with toothpaste and water, you may rinse your mouth with mouthwash if you wish. Do not swallow any toothpaste or  mouthwash.  Do not wear jewelry, make-up, hairpins, clips or nail polish.  Do not wear lotions, powders, or perfumes.   Do not shave 48 hours prior to surgery.   Contact lenses, hearing aids and dentures may not be worn into surgery.  Do not bring valuables to the hospital. [facility] is not responsible for any missing/lost belongings or valuables. .   Notify your doctor if there is any change in your medical condition (cold, fever, infection).  Wear comfortable clothing (specific to your surgery type) to the hospital.  Plan for stool softeners for home use; pain medications have a tendency to cause constipation. You can also help prevent constipation by eating foods high in fiber such as fruits and vegetables and drinking plenty of fluids as your diet allows.  After surgery, you can help prevent lung complications by doing breathing exercises.  Take deep breaths and cough every 1-2 hours. Your doctor may order a device called an Incentive Spirometer to help you take deep breaths. When coughing or sneezing, hold a pillow firmly against your incision with both hands. This is called "splinting." Doing this helps protect your incision. It also decreases belly discomfort.  If you are being admitted to the hospital overnight, leave your suitcase in the car. After surgery it may be brought to your room.  If you are being discharged the day of surgery, you will not be allowed to drive home. You will need a responsible adult (18 years or older) to drive you home and stay with you that night.   If you  are taking public transportation, you will need to have a responsible adult (18 years or older) with you. Please confirm with your physician that it is acceptable to use public transportation.   Please call the [facility] at [phone] if you have any questions about these instructions.  Visitation Policy:  Patients undergoing a surgery or procedure may have one family  member or support person with them as long as that person is not COVID-19 positive or experiencing its symptoms.  That person may remain in the waiting area during the procedure.  Inpatient Visitation Update:   In an effort to ensure the safety of our team members and our patients, we are implementing a change to our visitation policy:  Effective Monday, Aug. 9, at 7 a.m., inpatients will be allowed one support person.  o The support person may change daily.  o The support person must pass our screening, gel in and out, and wear a mask at all times, including in the patient's room.  o Patients must also wear a mask when staff or their support person are in the room.  o Masking is required regardless of vaccination status.  Systemwide, no visitors 17 or younger.

## 2019-12-24 ENCOUNTER — Ambulatory Visit: Payer: 59 | Admitting: Anesthesiology

## 2019-12-24 ENCOUNTER — Ambulatory Visit
Admission: RE | Admit: 2019-12-24 | Discharge: 2019-12-24 | Disposition: A | Discharge status: Home / Self Care | Payer: 59 | Attending: Surgery | Admitting: Surgery

## 2019-12-24 ENCOUNTER — Other Ambulatory Visit: Payer: Self-pay

## 2019-12-24 ENCOUNTER — Encounter: Payer: Self-pay | Admitting: Surgery

## 2019-12-24 ENCOUNTER — Encounter
Admission: RE | Disposition: A | Discharge status: Home / Self Care | Payer: Self-pay | Source: Home / Self Care | Attending: Surgery

## 2019-12-24 DIAGNOSIS — G5601 Carpal tunnel syndrome, right upper limb: Secondary | ICD-10-CM | POA: Insufficient documentation

## 2019-12-24 DIAGNOSIS — Z7984 Long term (current) use of oral hypoglycemic drugs: Secondary | ICD-10-CM | POA: Insufficient documentation

## 2019-12-24 DIAGNOSIS — Z881 Allergy status to other antibiotic agents status: Secondary | ICD-10-CM | POA: Diagnosis not present

## 2019-12-24 DIAGNOSIS — Z79899 Other long term (current) drug therapy: Secondary | ICD-10-CM | POA: Insufficient documentation

## 2019-12-24 DIAGNOSIS — Z880 Allergy status to penicillin: Secondary | ICD-10-CM | POA: Diagnosis not present

## 2019-12-24 HISTORY — PX: CARPAL TUNNEL RELEASE: SHX101

## 2019-12-24 LAB — POCT I-STAT, CHEM 8
BUN: 20 mg/dL (ref 6–20)
Calcium, Ion: 1.06 mmol/L — ABNORMAL LOW (ref 1.15–1.40)
Chloride: 103 mmol/L (ref 98–111)
Creatinine, Ser: 0.5 mg/dL (ref 0.44–1.00)
Glucose, Bld: 190 mg/dL — ABNORMAL HIGH (ref 70–99)
HCT: 38 % (ref 36.0–46.0)
Hemoglobin: 12.9 g/dL (ref 12.0–15.0)
Potassium: 3.9 mmol/L (ref 3.5–5.1)
Sodium: 138 mmol/L (ref 135–145)
TCO2: 25 mmol/L (ref 22–32)

## 2019-12-24 LAB — GLUCOSE, CAPILLARY
Glucose-Capillary: 192 mg/dL — ABNORMAL HIGH (ref 70–99)
Glucose-Capillary: 135 mg/dL — ABNORMAL HIGH (ref 70–99)

## 2019-12-24 SURGERY — RELEASE, CARPAL TUNNEL, ENDOSCOPIC
Anesthesia: General | Site: Wrist | Laterality: Right

## 2019-12-24 MED ORDER — HYDROCODONE-ACETAMINOPHEN 7.5-325 MG PO TABS
1.0000 | ORAL_TABLET | Freq: Once | ORAL | Status: AC | PRN
  Administered 2019-12-24: 1 via ORAL

## 2019-12-24 MED ORDER — PHENYLEPHRINE HCL (PRESSORS) 10 MG/ML IV SOLN
INTRAVENOUS | Status: DC | PRN
  Administered 2019-12-24: 100 ug via INTRAVENOUS

## 2019-12-24 MED ORDER — SODIUM CHLORIDE 0.9 % IV SOLN: INTRAVENOUS | Status: DC

## 2019-12-24 MED ORDER — FENTANYL CITRATE (PF) 100 MCG/2ML IJ SOLN
INTRAMUSCULAR | Status: AC
  Filled 2019-12-24: qty 2

## 2019-12-24 MED ORDER — PROPOFOL 10 MG/ML IV BOLUS
INTRAVENOUS | Status: DC | PRN
  Administered 2019-12-24: 150 mg via INTRAVENOUS

## 2019-12-24 MED ORDER — LIDOCAINE HCL (PF) 2 % IJ SOLN
INTRAMUSCULAR | Status: AC
  Filled 2019-12-24: qty 5

## 2019-12-24 MED ORDER — KETOROLAC TROMETHAMINE 30 MG/ML IJ SOLN
30.0000 mg | Freq: Once | INTRAMUSCULAR | Status: AC | PRN
  Administered 2019-12-24: 30 mg via INTRAVENOUS

## 2019-12-24 MED ORDER — TRAMADOL HCL 50 MG PO TABS
50.0000 mg | ORAL_TABLET | Freq: Four times a day (QID) | ORAL | 0 refills | Status: DC | PRN
Start: 2019-12-24 — End: 2020-02-16

## 2019-12-24 MED ORDER — FENTANYL CITRATE (PF) 100 MCG/2ML IJ SOLN
INTRAMUSCULAR | Status: AC
  Administered 2019-12-24: 25 ug via INTRAVENOUS
  Filled 2019-12-24: qty 2

## 2019-12-24 MED ORDER — CHLORHEXIDINE GLUCONATE 0.12 % MT SOLN
OROMUCOSAL | Status: AC
  Administered 2019-12-24: 15 mL via OROMUCOSAL
  Filled 2019-12-24: qty 15

## 2019-12-24 MED ORDER — DROPERIDOL 2.5 MG/ML IJ SOLN
0.6250 mg | Freq: Once | INTRAMUSCULAR | Status: DC | PRN
  Filled 2019-12-24: qty 2

## 2019-12-24 MED ORDER — FENTANYL CITRATE (PF) 100 MCG/2ML IJ SOLN
INTRAMUSCULAR | Status: DC | PRN
  Administered 2019-12-24 (×2): 50 ug via INTRAVENOUS

## 2019-12-24 MED ORDER — PROMETHAZINE HCL 25 MG/ML IJ SOLN: 6.2500 mg | INTRAMUSCULAR | Status: DC | PRN

## 2019-12-24 MED ORDER — CLINDAMYCIN PHOSPHATE 900 MG/50ML IV SOLN
INTRAVENOUS | Status: AC
  Filled 2019-12-24: qty 50

## 2019-12-24 MED ORDER — CHLORHEXIDINE GLUCONATE 0.12 % MT SOLN: 15.0000 mL | Freq: Once | OROMUCOSAL | Status: AC

## 2019-12-24 MED ORDER — KETOROLAC TROMETHAMINE 30 MG/ML IJ SOLN
INTRAMUSCULAR | Status: AC
  Filled 2019-12-24: qty 1

## 2019-12-24 MED ORDER — ACETAMINOPHEN 325 MG PO TABS: 325.0000 mg | ORAL_TABLET | ORAL | Status: DC | PRN

## 2019-12-24 MED ORDER — FAMOTIDINE 20 MG PO TABS: 20.0000 mg | ORAL_TABLET | Freq: Once | ORAL | Status: AC

## 2019-12-24 MED ORDER — FENTANYL CITRATE (PF) 100 MCG/2ML IJ SOLN
25.0000 ug | INTRAMUSCULAR | Status: DC | PRN
  Administered 2019-12-24 (×2): 25 ug via INTRAVENOUS

## 2019-12-24 MED ORDER — BUPIVACAINE HCL (PF) 0.5 % IJ SOLN
INTRAMUSCULAR | Status: DC | PRN
  Administered 2019-12-24: 10 mL

## 2019-12-24 MED ORDER — ONDANSETRON HCL 4 MG/2ML IJ SOLN
INTRAMUSCULAR | Status: AC
  Filled 2019-12-24: qty 2

## 2019-12-24 MED ORDER — ORAL CARE MOUTH RINSE: 15.0000 mL | Freq: Once | OROMUCOSAL | Status: AC

## 2019-12-24 MED ORDER — LIDOCAINE HCL (CARDIAC) PF 100 MG/5ML IV SOSY
PREFILLED_SYRINGE | INTRAVENOUS | Status: DC | PRN
  Administered 2019-12-24: 100 mg via INTRAVENOUS

## 2019-12-24 MED ORDER — ONDANSETRON HCL 4 MG/2ML IJ SOLN
INTRAMUSCULAR | Status: DC | PRN
  Administered 2019-12-24: 4 mg via INTRAVENOUS

## 2019-12-24 MED ORDER — FAMOTIDINE 20 MG PO TABS
ORAL_TABLET | ORAL | Status: AC
  Administered 2019-12-24: 20 mg via ORAL
  Filled 2019-12-24: qty 1

## 2019-12-24 MED ORDER — CLINDAMYCIN PHOSPHATE 900 MG/50ML IV SOLN
900.0000 mg | INTRAVENOUS | Status: AC
  Administered 2019-12-24: 900 mg via INTRAVENOUS

## 2019-12-24 MED ORDER — ACETAMINOPHEN 160 MG/5ML PO SOLN
325.0000 mg | ORAL | Status: DC | PRN
  Filled 2019-12-24: qty 20.3

## 2019-12-24 MED ORDER — HYDROCODONE-ACETAMINOPHEN 7.5-325 MG PO TABS
ORAL_TABLET | ORAL | Status: AC
  Filled 2019-12-24: qty 1

## 2019-12-24 SURGICAL SUPPLY — 32 items
APL PRP STRL LF DISP 70% ISPRP (MISCELLANEOUS) ×1
BNDG COHESIVE 4X5 TAN STRL (GAUZE/BANDAGES/DRESSINGS) ×3 IMPLANT
BNDG ELASTIC 2X5.8 VLCR STR LF (GAUZE/BANDAGES/DRESSINGS) ×3 IMPLANT
BNDG ESMARK 4X12 TAN STRL LF (GAUZE/BANDAGES/DRESSINGS) ×3 IMPLANT
CANISTER SUCT 1200ML W/VALVE (MISCELLANEOUS) ×3 IMPLANT
CHLORAPREP W/TINT 26 (MISCELLANEOUS) ×3 IMPLANT
CORD BIP STRL DISP 12FT (MISCELLANEOUS) ×3 IMPLANT
COVER WAND RF STERILE (DRAPES) ×3 IMPLANT
CUFF TOURN SGL QUICK 18X4 (TOURNIQUET CUFF) ×3 IMPLANT
DRAPE SURG 17X11 SM STRL (DRAPES) ×3 IMPLANT
FORCEPS JEWEL BIP 4-3/4 STR (INSTRUMENTS) ×3 IMPLANT
GAUZE SPONGE 4X4 12PLY STRL (GAUZE/BANDAGES/DRESSINGS) ×3 IMPLANT
GAUZE XEROFORM 1X8 LF (GAUZE/BANDAGES/DRESSINGS) ×3 IMPLANT
GLOVE BIO SURGEON STRL SZ8 (GLOVE) ×3 IMPLANT
GLOVE INDICATOR 8.0 STRL GRN (GLOVE) ×3 IMPLANT
GOWN STRL REUS W/ TWL LRG LVL3 (GOWN DISPOSABLE) ×1 IMPLANT
GOWN STRL REUS W/ TWL XL LVL3 (GOWN DISPOSABLE) ×1 IMPLANT
GOWN STRL REUS W/TWL LRG LVL3 (GOWN DISPOSABLE) ×3
GOWN STRL REUS W/TWL XL LVL3 (GOWN DISPOSABLE) ×3
KIT CARPAL TUNNEL (MISCELLANEOUS) ×3
KIT ESCP INSRT D SLOT CANN KN (MISCELLANEOUS) ×1 IMPLANT
KIT TURNOVER KIT A (KITS) ×3 IMPLANT
NS IRRIG 500ML POUR BTL (IV SOLUTION) ×3 IMPLANT
PACK EXTREMITY (MISCELLANEOUS) ×3 IMPLANT
SPLINT WRIST LG LT TX990309 (SOFTGOODS) IMPLANT
SPLINT WRIST LG RT TX900304 (SOFTGOODS) IMPLANT
SPLINT WRIST M LT TX990308 (SOFTGOODS) IMPLANT
SPLINT WRIST M RT TX990303 (SOFTGOODS) ×2 IMPLANT
SPLINT WRIST XL LT TX990310 (SOFTGOODS) IMPLANT
SPLINT WRIST XL RT TX990305 (SOFTGOODS) IMPLANT
STOCKINETTE IMPERVIOUS 9X36 MD (GAUZE/BANDAGES/DRESSINGS) ×3 IMPLANT
SUT PROLENE 4 0 PS 2 18 (SUTURE) ×3 IMPLANT

## 2019-12-24 NOTE — Anesthesia Procedure Notes (Signed)
Procedure Name: LMA Insertion Date/Time: 12/24/2019 10:32 AM Performed by: Chanetta Marshall, CRNA Pre-anesthesia Checklist: Patient identified, Emergency Drugs available, Suction available and Patient being monitored Patient Re-evaluated:Patient Re-evaluated prior to induction Oxygen Delivery Method: Circle system utilized Preoxygenation: Pre-oxygenation with 100% oxygen Induction Type: IV induction Ventilation: Mask ventilation without difficulty LMA: LMA inserted LMA Size: 4.0 Tube type: Oral Number of attempts: 1 Placement Confirmation: positive ETCO2,  breath sounds checked- equal and bilateral and CO2 detector Tube secured with: Tape Dental Injury: Teeth and Oropharynx as per pre-operative assessment

## 2019-12-24 NOTE — H&P (Signed)
History of Present Illness:  Sheena Ruiz is a 59 y.o. female who presents with a long history of bilateral hand and wrist pain and paresthesias, right greater than left. She states that she has been diagnosed with carpal tunnel syndrome in the past and has tried using splints at night with limited benefit. Her symptoms are worse with any repetitive keyboard work, as well as with any other repetitive activities. She has occasional discomfort at night as well.  Current Outpatient Medications: . acetaminophen (TYLENOL) 500 MG tablet Take 1,000 mg by mouth as needed for Pain  . ALPRAZolam (XANAX) 0.5 MG tablet Take 1 tablet (0.5 mg total) by mouth as needed for Anxiety (take 1-2 tablets 30-45 min prior to MRI) for up to 2 doses 2 tablet 0  . EFFEXOR XR 150 mg XR capsule TAKE 1 CAPSULE BY MOUTH ONCE DAILY (Patient taking differently: every morning ) 30 capsule 5  . estradioL (ESTRACE) 0.01 % (0.1 mg/gram) vaginal cream Insert pea sized amount vaginally nightly x 4 weeks prior to surgery. 30 g 2  . glipiZIDE (GLUCOTROL) 5 MG tablet Take 5 mg by mouth 2 (two) times daily before meals  . metFORMIN (GLUCOPHAGE-XR) 500 MG XR tablet TAKE 4 TABLETS BY MOUTH EVERY MORNING WITH BREAKFAST  . multivitamin tablet Take 1 tablet by mouth every morning  . naproxen sodium (ALEVE) 220 MG tablet Take 440 mg by mouth as needed for Pain  . olmesartan-hydrochlorothiazide (BENICAR HCT) 20-12.5 mg tablet TAKE 1 TABLET BY MOUTH ONCE DAILY (Patient taking differently: Take 1 tablet by mouth every morning ) 30 tablet 5   No current Epic-ordered facility-administered medications on file.   Allergies:  . Doxycycline Rash  . Dulaglutide Abdominal Pain, Diarrhea and Nausea  GI Upset . Empagliflozin Other (See Comments)  Yeast infections  . Fenofibrate Micronized Rash  "felt bad and heavy."  . Penicillins Rash  Pt has tolerated oral Cephalosporins in past   Past Medical History:  . Anesthesia complication  fast heart  rate only in 2001  . Depression  . Diabetes mellitus type 2, uncomplicated (CMS-HCC)  . Hyperlipidemia  . Hypertension  . Malignant neoplasm of upper-inner quadrant of left breast in female, estrogen receptor positive (CMS-HCC) 03/05/2018   Past Surgical History:  . BIOPSY/EXCISION LYMPH NODE AXILLARY Left 05/13/2018  Procedure: BIOPSY OR EXCISION OF LYMPH NODE(S); OPEN, DEEP AXILLARY NODE(S); Surgeon: Johnna Acosta, MD; Location: [facility]; Service: General Surgery; Laterality: Left;  . CESAREAN SECTION 2003  . CHOLECYSTECTOMY 1997  . COLONOSCOPY 09/19/2011, 12/27/2006  Adenomatous Polyp, FHCC (Father), FH Colon Polyps (Father): [facility] 08/2016; Recall Ltr mailed 08/03/2016 (dw)  . COLONOSCOPY 04/22/2018  Adenomatous Polyp; [place] (Father) [facility] 04/2023  . foreign body removal Right  . MASTECTOMY PARTIAL Left 05/13/2018  Procedure: MASTECTOMY, PARTIAL [place]; Surgeon: Johnna Acosta, MD; Location: [facility]; Service: General Surgery; Laterality: Left;  . Nasal Surgery 2005  fungal mass removed; deviated septum  . OTHER SURGICAL HISTORY 2001  ankle surgery  . PERCUTANEOUS BIOPSY BREAST Left 03/21/2018  03/21/2018: Left breast US-guided core needle biopsy of the mass at 11:30 (ribbon clip placed/verified) - invasive ductal carcinoma, grade 1, ER+/PR+/HER2-  . TOTAL ANKLE REPLACEMENT 2012  . TUBAL LIGATION   Family History:  . Depression Mother  . High blood pressure (Hypertension) Mother  . Osteoporosis (Thinning of bones) Mother  . Cervical cancer Mother  . Uterine cancer Mother  . Depression Father  . Prostate cancer Father  . Stroke Father  . High  blood pressure (Hypertension) Father  . Colon cancer Father  . Colon polyps Father  . High blood pressure (Hypertension) Sister  . High blood pressure (Hypertension) Brother  . Depression Brother  . High blood pressure (Hypertension) Maternal Grandmother  . Stroke Maternal Grandmother  . Diabetes type II Maternal Grandmother  .  Allergic rhinitis Maternal Grandmother  . Asthma Maternal Grandmother  . Osteoporosis (Thinning of bones) Maternal Grandmother  . Depression Maternal Grandmother  . Myocardial Infarction (Heart attack) Maternal Grandfather  . High blood pressure (Hypertension) Maternal Grandfather  . Allergic rhinitis Maternal Grandfather  . Asthma Maternal Grandfather  . Depression Maternal Grandfather  . Myocardial Infarction (Heart attack) Paternal Grandmother  . High blood pressure (Hypertension) Paternal Grandmother  . Stroke Paternal Grandmother  . Diabetes type II Paternal Grandmother  . Depression Paternal Grandmother  . Osteoporosis (Thinning of bones) Paternal Grandmother  . Myocardial Infarction (Heart attack) Paternal Grandfather  . High blood pressure (Hypertension) Paternal Grandfather  . Depression Paternal Grandfather  . Breast cancer Cousin  . Breast cancer Cousin  . Breast cancer Paternal Aunt  . Ovarian cancer Paternal Aunt  . Colon cancer Paternal Aunt  . Lung cancer Maternal Uncle  . Colon cancer Maternal Uncle  . Lung cancer Maternal Uncle  . Liver cancer Maternal Uncle  . Anesthesia problems Neg Hx   Social History:   Socioeconomic History:  Marland Kitchen Marital status: Married  Spouse name: Not on file  . Number of children: 2  . Years of education: [employer]  . Highest education level: Not on file  Occupational History  . Occupation: Retired  Tobacco Use  . Smoking status: Never Smoker  . Smokeless tobacco: Never Used  Vaping Use  . Vaping Use: Never used  Substance and Sexual Activity  . Alcohol use: Yes  Alcohol/week: 0.0 standard drinks  Comment: "Rarely"  . Drug use: Never  . Sexual activity: Yes  Partners: Male  Birth control/protection: Post-menopausal  Other Topics Concern  . Not on file  Social History Narrative  Preferred pronouns: She/Her/Hers  Living Arrangements: The patient is married and lives in [place]. She homeschools her teenage son.  Her husband  is a family Engineer, petroleum in [place].  Education: Masters for religious history.  Occupation: Retired  Religious preference: Protestant  Exercise: walk  Diet: diabetic   Social Determinants of Health:   Merchant navy officer:  . Difficulty of Paying Living Expenses:  Food Insecurity:  . Worried About Charity fundraiser in the Last Year:  . Arboriculturist in the Last Year:  Transportation Needs:  . Film/video editor (Medical):  Marland Kitchen Lack of Transportation (Non-Medical):   Review of Systems:  A comprehensive 14 point ROS was performed, reviewed, and the pertinent orthopaedic findings are documented in the HPI.  Physical Exam: Vitals:  11/30/19 1018  BP: 118/76  Weight: 90.8 kg (200 lb 3.2 oz)  Height: 161 cm (5' 3.39")  PainSc: 6  PainLoc: Shoulder   General/Constitutional: The patient appears to be well-nourished, well-developed, and in no acute distress. Neuro/Psych: Normal mood and affect, oriented to person, place and time.  Right wrist exam: Skin inspection of the right wrist is unremarkable. No swelling, erythema, ecchymosis, abrasions, or other skin abnormalities are identified. She exhibits full active and passive range of motion of the wrist without any pain or catching. She is able active flex and extend all digits fully without any pain or triggering. She is neurovascularly intact to all digits. She has  an equivocally positive Phalen's test and a negative Tinel's test.  Assessment: Right carpal tunnel syndrome.   Plan: The treatment options were discussed with the patient. In addition, patient educational materials were provided regarding the diagnosis and treatment options. The patient is quite frustrated by her symptoms and functional limitations, and is ready to consider more aggressive treatment options. Therefore, I have recommended a surgical procedure, specifically an endoscopic right carpal tunnel release. The procedure was discussed with the  patient, as were the potential risks (including bleeding, infection, nerve and/or blood vessel injury, persistent or recurrent pain/paresthesias, weakness of grip, need for further surgery, blood clots, strokes, heart attacks and/or arhythmias, pneumonia, etc.) and benefits. The patient states her understanding and wishes to proceed. A consent will be obtained by the nursing staff. All of the patient's questions and concerns were answered. She can call any time with further concerns. She will follow up post-surgery, routine.   H&P reviewed and patient re-examined. No changes.

## 2019-12-24 NOTE — Transfer of Care (Signed)
Immediate Anesthesia Transfer of Care Note  Patient: Sheena Ruiz  Procedure(s) Performed: CARPAL TUNNEL RELEASE ENDOSCOPIC (Right Wrist)  Patient Location: PACU  Anesthesia Type:General  Level of Consciousness: awake, alert  and oriented  Airway & Oxygen Therapy: Patient Spontanous Breathing and Patient connected to face mask oxygen  Post-op Assessment: Report given to RN and Post -op Vital signs reviewed and stable  Post vital signs: Reviewed and stable  Last Vitals:  Vitals Value Taken Time  BP    Temp    Pulse    Resp    SpO2      Last Pain:  Vitals:   12/24/19 0856  TempSrc: Temporal  PainSc: 0-No pain         Complications: No complications documented.

## 2019-12-24 NOTE — Anesthesia Preprocedure Evaluation (Signed)
Anesthesia Evaluation  Patient identified by MRN, date of birth, ID band Patient awake  General Assessment Comment:Had "tachycardia" once after anesthesia. Had anesthesia many times since without issue.  Reviewed: Allergy & Precautions, H&P , NPO status , reviewed documented beta blocker date and time   History of Anesthesia Complications (+) history of anesthetic complications  Airway Mallampati: III  TM Distance: >3 FB Neck ROM: full    Dental  (+) Caps Perm bridge:   Pulmonary sleep apnea ,  Doesn't use CPAP, OSA not officially diagnosed. Educated re: OSA and GA   Pulmonary exam normal        Cardiovascular hypertension, Normal cardiovascular exam     Neuro/Psych  Headaches, PSYCHIATRIC DISORDERS Depression    GI/Hepatic hiatal hernia, GERD  Medicated and Controlled,  Endo/Other  diabetes  Renal/GU      Musculoskeletal  (+) Arthritis ,   Abdominal   Peds  Hematology   Anesthesia Other Findings Past Medical History: No date: Arthritis No date: Cancer ([place])     Comment:  LEFT BREAST 6010'X: Complication of anesthesia     Comment:  HARD TO WAKE UP X1 No date: Depression No date: Diabetes mellitus     Comment:  TYPE 2 No date: Family history of breast cancer No date: Family history of colon cancer No date: Family history of ovarian cancer No date: Family history of uterine cancer No date: GERD (gastroesophageal reflux disease)     Comment:  OCC No date: Headache     Comment:  H/O MIGRAINES No date: Hearing loss     Comment:  RIGHT EAR No date: History of hiatal hernia     Comment:  SMALL No date: History of kidney stones     Comment:  H/O No date: Hyperlipidemia No date: Hypertension No date: Sleep apnea     Comment:  MILD NO CPAP Past Surgical History: No date: ankle replacement No date: BREAST SURGERY     Comment:  LUMPECTOMY No date: CESAREAN SECTION No date: CHOLECYSTECTOMY No date:  COLONOSCOPY 09/24/2014: FOREIGN BODY REMOVAL EAR; Right     Comment:  Procedure: REMOVAL FOREIGN BODY EAR EUA;  Surgeon:               Beverly Gust, MD;  Location: [facility];                Service: ENT;  Laterality: Right;  DIABETIC-ORAL MEDS,               EUA bilaterally 08/31/2019: HYSTEROSCOPY WITH D & C; N/A     Comment:  Procedure: DILATATION AND CURETTAGE /HYSTEROSCOPY;                Surgeon: Benjaman Kindler, MD;  Location: [facility];                Service: Gynecology;  Laterality: N/A; No date: TOTAL ANKLE REPLACEMENT No date: TUBAL LIGATION BMI    Body Mass Index: 33.99 kg/m     Reproductive/Obstetrics                             Anesthesia Physical Anesthesia Plan  ASA: II  Anesthesia Plan: General   Post-op Pain Management:    Induction: Intravenous  PONV Risk Score and Plan: 3 and Ondansetron, Treatment may vary due to age or medical condition, Midazolam and Diphenhydramine  Airway Management Planned: Oral ETT  Additional Equipment:   Intra-op Plan:  Post-operative Plan: Extubation in OR  Informed Consent: I have reviewed the patients History and Physical, chart, labs and discussed the procedure including the risks, benefits and alternatives for the proposed anesthesia with the patient or authorized representative who has indicated his/her understanding and acceptance.     Dental Advisory Given  Plan Discussed with: CRNA  Anesthesia Plan Comments:         Anesthesia Quick Evaluation

## 2019-12-24 NOTE — Discharge Instructions (Addendum)
Orthopedic discharge instructions: Keep dressing dry and intact. Keep hand elevated above heart level. May shower after dressing removed on postop day 4 (Monday). Cover sutures with Band-Aids after drying off. Apply ice to affected area frequently. Take ibuprofen 600-800 mg TID with meals for 7-10 days, then as necessary. Take ES Tylenol or pain medication as prescribed when needed.  Return for follow-up in 10-14 days or as scheduled.   AMBULATORY SURGERY  DISCHARGE INSTRUCTIONS   1) The drugs that you were given will stay in your system until tomorrow so for the next 24 hours you should not:  A) Drive an automobile B) Make any legal decisions C) Drink any alcoholic beverage   2) You may resume regular meals tomorrow.  Today it is better to start with liquids and gradually work up to solid foods.  You may eat anything you prefer, but it is better to start with liquids, then soup and crackers, and gradually work up to solid foods.   3) Please notify your doctor immediately if you have any unusual bleeding, trouble breathing, redness and pain at the surgery site, drainage, fever, or pain not relieved by medication.    4) Additional Instructions:        Please contact your physician with any problems or Same Day Surgery at [phone], Monday through Friday 6 am to 4 pm, or [place] at [facility] number at [phone].

## 2019-12-24 NOTE — Anesthesia Postprocedure Evaluation (Signed)
Anesthesia Post Note  Patient: Sheena Ruiz  Procedure(s) Performed: CARPAL TUNNEL RELEASE ENDOSCOPIC (Right Wrist)  Patient location during evaluation: PACU Anesthesia Type: General Level of consciousness: awake and alert Pain management: pain level controlled Vital Signs Assessment: post-procedure vital signs reviewed and stable Respiratory status: spontaneous breathing, nonlabored ventilation and respiratory function stable Cardiovascular status: blood pressure returned to baseline and stable Postop Assessment: no apparent nausea or vomiting Anesthetic complications: no   No complications documented.   Last Vitals:  Vitals:   12/24/19 1222 12/24/19 1234  BP:  119/68  Pulse: 90 93  Resp: 12 16  Temp: 37.1 C (!) 36.4 C  SpO2: 99% 97%    Last Pain:  Vitals:   12/24/19 1234  TempSrc: Tympanic  PainSc: 3                  [name]

## 2019-12-24 NOTE — Op Note (Signed)
12/24/2019  11:23 AM  Patient:   Sheena Ruiz  Pre-Op Diagnosis:   Right carpal tunnel syndrome.  Post-Op Diagnosis:   Same.  Procedure:   Endoscopic right carpal tunnel release.  Surgeon:   Pascal Lux, MD  Assistant:   Sherlene Shams, PA-S  Anesthesia:   General LMA  Findings:   As above.  Complications:   None  EBL:   0 cc  Fluids:   400 cc crystalloid  TT:   17 minutes at 250 mmHg  Drains:   None  Closure:   4-0 Prolene interrupted sutures  Brief Clinical Note:   The patient is a 59 year old female with a long history of progressively worsening pain and paresthesias to her right hand. Her symptoms have progressed despite medications, activity modification, etc. Her history and examination are consistent with carpal tunnel syndrome. The patient presents at this time for an endoscopic right carpal tunnel release.   Procedure:   The patient was brought into the operating room and lain in the supine position. After adequate general laryngeal mask anesthesia was obtained, the right hand and upper extremity were prepped with ChloraPrep solution before being draped sterilely. Preoperative antibiotics were administered. A timeout was performed to verify the appropriate surgical site before the limb was exsanguinated with an Esmarch and the tourniquet inflated to 250 mmHg. An approximately 1.5-2 cm incision was made over the volar wrist flexion crease, centered over the palmaris longus tendon. The incision was carried down through the subcutaneous tissues with care taken to identify and protect any neurovascular structures. The distal forearm fascia was penetrated just proximal to the transverse carpal ligament. The soft tissues were released off the superficial and deep surfaces of the distal forearm fascia and this was released proximally for 3-4 cm under direct visualization.  Attention was directed distally. The Soil scientist was passed beneath the transverse carpal  ligament along the ulnar aspect of the carpal tunnel and used to release any adhesions as well as to remove any adherent synovial tissue before first the smaller then the larger of the two dilators were passed beneath the transverse carpal ligament along the ulnar margin of the carpal tunnel. The slotted cannula was introduced and the endoscope was placed into the slotted cannula and the undersurface of the transverse carpal ligament visualized. The distal margin of the transverse carpal ligament was marked by placing a 25-gauge needle percutaneously at [place] cardinal point so that it entered the distal portion of the slotted cannula. Under endoscopic visualization, the transverse carpal ligament was released from proximal to distal using the end-cutting blade. A second pass was performed to ensure complete release of the ligament. The adequacy of release was verified both endoscopically and by palpation using the freer elevator.  The wound was irrigated thoroughly with sterile saline solution before being closed using 4-0 Prolene interrupted sutures. A total of 10 cc of 0.5% plain Sensorcaine was injected in and around the incision before a sterile bulky dressing was applied to the wound. The patient was placed into a volar wrist splint before being awakened and returned to the recovery room in satisfactory condition after tolerating the procedure well.

## 2019-12-25 ENCOUNTER — Encounter: Payer: Self-pay | Admitting: Surgery

## 2020-01-05 DIAGNOSIS — Z23 Encounter for immunization: Secondary | ICD-10-CM | POA: Diagnosis not present

## 2020-01-12 ENCOUNTER — Other Ambulatory Visit: Payer: Self-pay | Admitting: Student

## 2020-01-12 DIAGNOSIS — M7522 Bicipital tendinitis, left shoulder: Secondary | ICD-10-CM

## 2020-01-12 DIAGNOSIS — M7582 Other shoulder lesions, left shoulder: Secondary | ICD-10-CM | POA: Diagnosis not present

## 2020-01-12 DIAGNOSIS — M25512 Pain in left shoulder: Secondary | ICD-10-CM

## 2020-01-12 DIAGNOSIS — M7581 Other shoulder lesions, right shoulder: Secondary | ICD-10-CM | POA: Diagnosis not present

## 2020-01-12 DIAGNOSIS — G8929 Other chronic pain: Secondary | ICD-10-CM

## 2020-01-12 DIAGNOSIS — M7521 Bicipital tendinitis, right shoulder: Secondary | ICD-10-CM | POA: Diagnosis not present

## 2020-01-26 ENCOUNTER — Other Ambulatory Visit: Payer: Self-pay | Admitting: Surgery

## 2020-01-31 ENCOUNTER — Other Ambulatory Visit: Payer: Self-pay | Admitting: Family Medicine

## 2020-02-02 ENCOUNTER — Ambulatory Visit: Payer: 59

## 2020-02-05 ENCOUNTER — Inpatient Hospital Stay
Admission: RE | Admit: 2020-02-05 | Discharge: 2020-02-05 | Disposition: A | Discharge status: Home / Self Care | Payer: 59 | Source: Ambulatory Visit

## 2020-02-05 NOTE — Patient Instructions (Signed)
Your procedure is scheduled on: Tuesday, December 28 Report to the Registration Desk on the 1st floor of the [facility]. To find out your arrival time, please call [phone] between 1PM - 3PM on: Monday, December 27  REMEMBER: Instructions that are not followed completely may result in serious medical risk, up to and including death; or upon the discretion of your surgeon and anesthesiologist your surgery may need to be rescheduled.  Do not eat food after midnight the night before surgery.  No gum chewing, lozengers or hard candies.  You may however, drink water up to 2 hours before you are scheduled to arrive for your surgery. Do not drink anything within 2 hours of your scheduled arrival time.  DO NOT TAKE ANY MEDICATIONS THE MORNING OF SURGERY   Stop Metformin 2 days prior to surgery. LAST DAY TO TAKE IS Saturday, December 25; RESUME AFTER SURGERY.  One week prior to surgery: Stop Anti-inflammatories (NSAIDS) such as Advil, Aleve, Ibuprofen, Motrin, Naproxen, Naprosyn and Aspirin based products such as Excedrin, Goodys Powder, BC Powder. Stop ANY OVER THE COUNTER supplements until after surgery. (However, you may continue taking multivitamin up until the day before surgery.)  No Alcohol for 24 hours before or after surgery.  No Smoking including e-cigarettes for 24 hours prior to surgery.  No chewable tobacco products for at least 6 hours prior to surgery.  No nicotine patches on the day of surgery.  Do not use any "recreational" drugs for at least a week prior to your surgery.  Please be advised that the combination of cocaine and anesthesia may have negative outcomes, up to and including death. If you test positive for cocaine, your surgery will be cancelled.  On the morning of surgery brush your teeth with toothpaste and water, you may rinse your mouth with mouthwash if you wish. Do not swallow any toothpaste or mouthwash.  Do not wear jewelry, make-up, hairpins, clips  or nail polish.  Do not wear lotions, powders, or perfumes.   Do not shave body from the neck down 48 hours prior to surgery just in case you cut yourself which could leave a site for infection.  Also, freshly shaved skin may become irritated if using the CHG soap.  Contact lenses, hearing aids and dentures may not be worn into surgery.  Do not bring valuables to the hospital. [facility] is not responsible for any missing/lost belongings or valuables.   Use CHG Soap as directed on instruction sheet.  Bring your C-PAP to the hospital with you in case you may have to spend the night.   Notify your doctor if there is any change in your medical condition (cold, fever, infection).  Wear comfortable clothing (specific to your surgery type) to the hospital.  Plan for stool softeners for home use; pain medications have a tendency to cause constipation. You can also help prevent constipation by eating foods high in fiber such as fruits and vegetables and drinking plenty of fluids as your diet allows.  After surgery, you can help prevent lung complications by doing breathing exercises.  Take deep breaths and cough every 1-2 hours. Your doctor may order a device called an Incentive Spirometer to help you take deep breaths.  If you are being discharged the day of surgery, you will not be allowed to drive home. You will need a responsible adult (18 years or older) to drive you home and stay with you that night.   If you are taking public transportation, you  will need to have a responsible adult (18 years or older) with you. Please confirm with your physician that it is acceptable to use public transportation.   Please call the [place] [facility] at [phone] if you have any questions about these instructions.  Visitation Policy:  Patients undergoing a surgery or procedure may have one family member or support person with them as long as that person is not COVID-19 positive or  experiencing its symptoms.  That person may remain in the waiting area during the procedure.

## 2020-02-08 ENCOUNTER — Encounter
Admission: RE | Admit: 2020-02-08 | Discharge: 2020-02-08 | Disposition: A | Discharge status: Home / Self Care | Payer: 59 | Source: Ambulatory Visit | Attending: Surgery | Admitting: Surgery

## 2020-02-08 ENCOUNTER — Other Ambulatory Visit: Payer: Self-pay

## 2020-02-08 ENCOUNTER — Ambulatory Visit
Admission: RE | Admit: 2020-02-08 | Discharge: 2020-02-08 | Disposition: A | Discharge status: Home / Self Care | Payer: 59 | Source: Ambulatory Visit | Attending: Student | Admitting: Student

## 2020-02-08 DIAGNOSIS — M75111 Incomplete rotator cuff tear or rupture of right shoulder, not specified as traumatic: Secondary | ICD-10-CM | POA: Diagnosis not present

## 2020-02-08 DIAGNOSIS — M12812 Other specific arthropathies, not elsewhere classified, left shoulder: Secondary | ICD-10-CM | POA: Diagnosis not present

## 2020-02-08 DIAGNOSIS — M19012 Primary osteoarthritis, left shoulder: Secondary | ICD-10-CM | POA: Diagnosis not present

## 2020-02-08 DIAGNOSIS — M25512 Pain in left shoulder: Secondary | ICD-10-CM | POA: Insufficient documentation

## 2020-02-08 DIAGNOSIS — G8929 Other chronic pain: Secondary | ICD-10-CM | POA: Insufficient documentation

## 2020-02-08 DIAGNOSIS — M7582 Other shoulder lesions, left shoulder: Secondary | ICD-10-CM | POA: Insufficient documentation

## 2020-02-08 DIAGNOSIS — M7521 Bicipital tendinitis, right shoulder: Secondary | ICD-10-CM | POA: Diagnosis not present

## 2020-02-08 DIAGNOSIS — M7522 Bicipital tendinitis, left shoulder: Secondary | ICD-10-CM | POA: Diagnosis not present

## 2020-02-08 DIAGNOSIS — M7581 Other shoulder lesions, right shoulder: Secondary | ICD-10-CM | POA: Diagnosis not present

## 2020-02-08 NOTE — Patient Instructions (Signed)
Your procedure is scheduled on: Tuesday, December 28 Report to the Registration Desk on the 1st floor of the [facility]. To find out your arrival time, please call [phone] between 1PM - 3PM on: Monday, December 27  REMEMBER: Instructions that are not followed completely may result in serious medical risk, up to and including death; or upon the discretion of your surgeon and anesthesiologist your surgery may need to be rescheduled.  Do not eat food after midnight the night before surgery.  No gum chewing, lozengers or hard candies.  You may however, drink water up to 2 hours before you are scheduled to arrive for your surgery. Do not drink anything within 2 hours of your scheduled arrival time.  TAKE THESE MEDICATIONS THE MORNING OF SURGERY WITH A SIP OF WATER:  1.  Effexor  Stop Metformin 2 days prior to surgery. Last day to take is Saturday, December 25; resume after surgery.  One week prior to surgery: Stop Anti-inflammatories (NSAIDS) such as Advil, Aleve, Ibuprofen, Motrin, Naproxen, Naprosyn and Aspirin based products such as Excedrin, Goodys Powder, BC Powder. Stop ANY OVER THE COUNTER supplements until after surgery. (However, you may continue taking multivitamin up until the day before surgery.)  No Alcohol for 24 hours before or after surgery.  No Smoking including e-cigarettes for 24 hours prior to surgery.  No chewable tobacco products for at least 6 hours prior to surgery.  No nicotine patches on the day of surgery.  Do not use any "recreational" drugs for at least a week prior to your surgery.  Please be advised that the combination of cocaine and anesthesia may have negative outcomes, up to and including death. If you test positive for cocaine, your surgery will be cancelled.  On the morning of surgery brush your teeth with toothpaste and water, you may rinse your mouth with mouthwash if you wish. Do not swallow any toothpaste or mouthwash.  Do not wear  jewelry, make-up, hairpins, clips or nail polish.  Do not wear lotions, powders, or perfumes.   Do not shave body from the neck down 48 hours prior to surgery just in case you cut yourself which could leave a site for infection.  Also, freshly shaved skin may become irritated if using the CHG soap.  Contact lenses, hearing aids and dentures may not be worn into surgery.  Do not bring valuables to the hospital. [facility] is not responsible for any missing/lost belongings or valuables.   Use CHG Soap as directed on instruction sheet.  Notify your doctor if there is any change in your medical condition (cold, fever, infection).  Wear comfortable clothing (specific to your surgery type) to the hospital.  Plan for stool softeners for home use; pain medications have a tendency to cause constipation. You can also help prevent constipation by eating foods high in fiber such as fruits and vegetables and drinking plenty of fluids as your diet allows.  After surgery, you can help prevent lung complications by doing breathing exercises.  Take deep breaths and cough every 1-2 hours. Your doctor may order a device called an Incentive Spirometer to help you take deep breaths.  If you are being discharged the day of surgery, you will not be allowed to drive home. You will need a responsible adult (18 years or older) to drive you home and stay with you that night.   If you are taking public transportation, you will need to have a responsible adult (18 years or older) with you.  Please confirm with your physician that it is acceptable to use public transportation.   Please call the [place] Dept[facility] at [phone] if you have any questions about these instructions.  Visitation Policy:  Patients undergoing a surgery or procedure may have one family member or support person with them as long as that person is not COVID-19 positive or experiencing its symptoms.  That person may remain in  the waiting area during the procedure.

## 2020-02-13 ENCOUNTER — Other Ambulatory Visit: Payer: Self-pay | Admitting: Family Medicine

## 2020-02-14 ENCOUNTER — Other Ambulatory Visit: Payer: Self-pay | Admitting: Family Medicine

## 2020-02-15 ENCOUNTER — Other Ambulatory Visit
Admission: RE | Admit: 2020-02-15 | Discharge: 2020-02-15 | Disposition: A | Discharge status: Home / Self Care | Payer: 59 | Source: Ambulatory Visit | Attending: Surgery | Admitting: Surgery

## 2020-02-15 ENCOUNTER — Other Ambulatory Visit: Payer: Self-pay

## 2020-02-15 DIAGNOSIS — Z01812 Encounter for preprocedural laboratory examination: Secondary | ICD-10-CM | POA: Insufficient documentation

## 2020-02-15 DIAGNOSIS — Z20822 Contact with and (suspected) exposure to covid-19: Secondary | ICD-10-CM | POA: Diagnosis not present

## 2020-02-16 ENCOUNTER — Encounter: Payer: Self-pay | Admitting: Surgery

## 2020-02-16 ENCOUNTER — Encounter
Admission: RE | Disposition: A | Discharge status: Home / Self Care | Payer: Self-pay | Source: Ambulatory Visit | Attending: Surgery

## 2020-02-16 ENCOUNTER — Ambulatory Visit: Payer: 59 | Admitting: Anesthesiology

## 2020-02-16 ENCOUNTER — Ambulatory Visit
Admission: RE | Admit: 2020-02-16 | Discharge: 2020-02-16 | Disposition: A | Discharge status: Home / Self Care | Payer: 59 | Source: Ambulatory Visit | Attending: Surgery | Admitting: Surgery

## 2020-02-16 ENCOUNTER — Ambulatory Visit: Payer: 59

## 2020-02-16 DIAGNOSIS — Z881 Allergy status to other antibiotic agents status: Secondary | ICD-10-CM | POA: Diagnosis not present

## 2020-02-16 DIAGNOSIS — Z79899 Other long term (current) drug therapy: Secondary | ICD-10-CM | POA: Diagnosis not present

## 2020-02-16 DIAGNOSIS — Z888 Allergy status to other drugs, medicaments and biological substances status: Secondary | ICD-10-CM | POA: Insufficient documentation

## 2020-02-16 DIAGNOSIS — Z7984 Long term (current) use of oral hypoglycemic drugs: Secondary | ICD-10-CM | POA: Diagnosis not present

## 2020-02-16 DIAGNOSIS — M7521 Bicipital tendinitis, right shoulder: Secondary | ICD-10-CM | POA: Diagnosis not present

## 2020-02-16 DIAGNOSIS — Z79890 Hormone replacement therapy (postmenopausal): Secondary | ICD-10-CM | POA: Insufficient documentation

## 2020-02-16 DIAGNOSIS — M25511 Pain in right shoulder: Secondary | ICD-10-CM

## 2020-02-16 DIAGNOSIS — M25811 Other specified joint disorders, right shoulder: Secondary | ICD-10-CM | POA: Insufficient documentation

## 2020-02-16 DIAGNOSIS — M7581 Other shoulder lesions, right shoulder: Secondary | ICD-10-CM | POA: Diagnosis not present

## 2020-02-16 DIAGNOSIS — M75111 Incomplete rotator cuff tear or rupture of right shoulder, not specified as traumatic: Secondary | ICD-10-CM | POA: Insufficient documentation

## 2020-02-16 DIAGNOSIS — Z880 Allergy status to penicillin: Secondary | ICD-10-CM | POA: Insufficient documentation

## 2020-02-16 DIAGNOSIS — M75101 Unspecified rotator cuff tear or rupture of right shoulder, not specified as traumatic: Secondary | ICD-10-CM | POA: Diagnosis not present

## 2020-02-16 DIAGNOSIS — M7541 Impingement syndrome of right shoulder: Secondary | ICD-10-CM | POA: Diagnosis not present

## 2020-02-16 HISTORY — PX: SHOULDER ARTHROSCOPY WITH ROTATOR CUFF REPAIR AND OPEN BICEPS TENODESIS: SHX6677

## 2020-02-16 LAB — GLUCOSE, CAPILLARY
Glucose-Capillary: 161 mg/dL — ABNORMAL HIGH (ref 70–99)
Glucose-Capillary: 121 mg/dL — ABNORMAL HIGH (ref 70–99)
Glucose-Capillary: 144 mg/dL — ABNORMAL HIGH (ref 70–99)

## 2020-02-16 LAB — SARS CORONAVIRUS 2 (TAT 6-24 HRS): SARS Coronavirus 2: NEGATIVE

## 2020-02-16 SURGERY — SHOULDER ARTHROSCOPY WITH ROTATOR CUFF REPAIR AND OPEN BICEPS TENODESIS
Anesthesia: General | Site: Shoulder | Laterality: Right

## 2020-02-16 MED ORDER — POTASSIUM CHLORIDE IN NACL 20-0.9 MEQ/L-% IV SOLN
INTRAVENOUS | Status: DC
  Filled 2020-02-16 (×5): qty 1000

## 2020-02-16 MED ORDER — CHLORHEXIDINE GLUCONATE 0.12 % MT SOLN: 15.0000 mL | Freq: Once | OROMUCOSAL | Status: AC

## 2020-02-16 MED ORDER — ONDANSETRON HCL 4 MG/2ML IJ SOLN
INTRAMUSCULAR | Status: DC | PRN
  Administered 2020-02-16 (×2): 4 mg via INTRAVENOUS

## 2020-02-16 MED ORDER — OXYCODONE HCL 5 MG PO TABS
ORAL_TABLET | ORAL | Status: AC
  Filled 2020-02-16: qty 1

## 2020-02-16 MED ORDER — CEFAZOLIN SODIUM-DEXTROSE 2-4 GM/100ML-% IV SOLN
2.0000 g | INTRAVENOUS | Status: AC
  Administered 2020-02-16: 14:00:00 2 g via INTRAVENOUS

## 2020-02-16 MED ORDER — FENTANYL CITRATE (PF) 100 MCG/2ML IJ SOLN
25.0000 ug | INTRAMUSCULAR | Status: DC | PRN
  Administered 2020-02-16: 50 ug via INTRAVENOUS

## 2020-02-16 MED ORDER — FAMOTIDINE 20 MG PO TABS
ORAL_TABLET | ORAL | Status: AC
  Administered 2020-02-16: 12:00:00 20 mg via ORAL
  Filled 2020-02-16: qty 1

## 2020-02-16 MED ORDER — FENTANYL CITRATE (PF) 100 MCG/2ML IJ SOLN: 50.0000 ug | Freq: Once | INTRAMUSCULAR | Status: AC

## 2020-02-16 MED ORDER — SODIUM CHLORIDE 0.9 % IV SOLN
INTRAVENOUS | Status: DC | PRN
  Administered 2020-02-16: 14:00:00 15 ug/min via INTRAVENOUS

## 2020-02-16 MED ORDER — ROCURONIUM BROMIDE 100 MG/10ML IV SOLN
INTRAVENOUS | Status: DC | PRN
  Administered 2020-02-16: 10 mg via INTRAVENOUS
  Administered 2020-02-16: 50 mg via INTRAVENOUS

## 2020-02-16 MED ORDER — CHLORHEXIDINE GLUCONATE 0.12 % MT SOLN
OROMUCOSAL | Status: AC
  Administered 2020-02-16: 12:00:00 15 mL via OROMUCOSAL
  Filled 2020-02-16: qty 15

## 2020-02-16 MED ORDER — ONDANSETRON HCL 4 MG/2ML IJ SOLN: 4.0000 mg | Freq: Once | INTRAMUSCULAR | Status: DC | PRN

## 2020-02-16 MED ORDER — OXYCODONE HCL 5 MG/5ML PO SOLN: 5.0000 mg | Freq: Once | ORAL | Status: AC | PRN

## 2020-02-16 MED ORDER — SODIUM CHLORIDE 0.9 % IV SOLN: INTRAVENOUS | Status: DC

## 2020-02-16 MED ORDER — ONDANSETRON HCL 4 MG/2ML IJ SOLN: 4.0000 mg | Freq: Four times a day (QID) | INTRAMUSCULAR | Status: DC | PRN

## 2020-02-16 MED ORDER — EPINEPHRINE PF 1 MG/ML IJ SOLN
INTRAMUSCULAR | Status: AC
  Filled 2020-02-16: qty 1

## 2020-02-16 MED ORDER — BUPIVACAINE-EPINEPHRINE (PF) 0.5% -1:200000 IJ SOLN
INTRAMUSCULAR | Status: AC
  Filled 2020-02-16: qty 30

## 2020-02-16 MED ORDER — FAMOTIDINE 20 MG PO TABS: 20.0000 mg | ORAL_TABLET | Freq: Once | ORAL | Status: AC

## 2020-02-16 MED ORDER — METOCLOPRAMIDE HCL 5 MG/ML IJ SOLN: 5.0000 mg | Freq: Three times a day (TID) | INTRAMUSCULAR | Status: DC | PRN

## 2020-02-16 MED ORDER — ACETAMINOPHEN 10 MG/ML IV SOLN
INTRAVENOUS | Status: DC | PRN
  Administered 2020-02-16: 1000 mg via INTRAVENOUS

## 2020-02-16 MED ORDER — OXYCODONE HCL 5 MG PO TABS: 5.0000 mg | ORAL_TABLET | ORAL | Status: DC | PRN

## 2020-02-16 MED ORDER — BUPIVACAINE LIPOSOME 1.3 % IJ SUSP
INTRAMUSCULAR | Status: AC
  Filled 2020-02-16: qty 20

## 2020-02-16 MED ORDER — MIDAZOLAM HCL 2 MG/2ML IJ SOLN: 1.0000 mg | Freq: Once | INTRAMUSCULAR | Status: DC

## 2020-02-16 MED ORDER — LACTATED RINGERS IV SOLN: INTRAVENOUS | Status: DC | PRN

## 2020-02-16 MED ORDER — DEXMEDETOMIDINE (PRECEDEX) IN NS 20 MCG/5ML (4 MCG/ML) IV SYRINGE
PREFILLED_SYRINGE | INTRAVENOUS | Status: DC | PRN
  Administered 2020-02-16: 8 ug via INTRAVENOUS

## 2020-02-16 MED ORDER — METOCLOPRAMIDE HCL 10 MG PO TABS: 5.0000 mg | ORAL_TABLET | Freq: Three times a day (TID) | ORAL | Status: DC | PRN

## 2020-02-16 MED ORDER — PHENYLEPHRINE HCL (PRESSORS) 10 MG/ML IV SOLN: INTRAVENOUS | Status: DC | PRN

## 2020-02-16 MED ORDER — BUPIVACAINE-EPINEPHRINE 0.5% -1:200000 IJ SOLN
INTRAMUSCULAR | Status: DC | PRN
  Administered 2020-02-16: 30 mL

## 2020-02-16 MED ORDER — FENTANYL CITRATE (PF) 100 MCG/2ML IJ SOLN
INTRAMUSCULAR | Status: AC
  Administered 2020-02-16: 16:00:00 50 ug via INTRAVENOUS
  Filled 2020-02-16: qty 2

## 2020-02-16 MED ORDER — ORAL CARE MOUTH RINSE: 15.0000 mL | Freq: Once | OROMUCOSAL | Status: AC

## 2020-02-16 MED ORDER — ACETAMINOPHEN 10 MG/ML IV SOLN
INTRAVENOUS | Status: AC
  Filled 2020-02-16: qty 100

## 2020-02-16 MED ORDER — EPHEDRINE SULFATE 50 MG/ML IJ SOLN
INTRAMUSCULAR | Status: DC | PRN
  Administered 2020-02-16: 2.5 mg via INTRAVENOUS
  Administered 2020-02-16 (×2): 10 mg via INTRAVENOUS

## 2020-02-16 MED ORDER — LIDOCAINE HCL (PF) 1 % IJ SOLN
INTRAMUSCULAR | Status: AC
  Filled 2020-02-16: qty 5

## 2020-02-16 MED ORDER — MIDAZOLAM HCL 2 MG/2ML IJ SOLN
INTRAMUSCULAR | Status: AC
  Filled 2020-02-16: qty 2

## 2020-02-16 MED ORDER — BUPIVACAINE HCL (PF) 0.5 % IJ SOLN
INTRAMUSCULAR | Status: AC
  Filled 2020-02-16: qty 10

## 2020-02-16 MED ORDER — CEFAZOLIN SODIUM-DEXTROSE 2-4 GM/100ML-% IV SOLN
INTRAVENOUS | Status: AC
  Filled 2020-02-16: qty 100

## 2020-02-16 MED ORDER — ONDANSETRON HCL 4 MG PO TABS: 4.0000 mg | ORAL_TABLET | Freq: Four times a day (QID) | ORAL | Status: DC | PRN

## 2020-02-16 MED ORDER — DEXAMETHASONE SODIUM PHOSPHATE 10 MG/ML IJ SOLN
INTRAMUSCULAR | Status: DC | PRN
  Administered 2020-02-16: 10 mg via INTRAVENOUS

## 2020-02-16 MED ORDER — FENTANYL CITRATE (PF) 100 MCG/2ML IJ SOLN
INTRAMUSCULAR | Status: AC
  Administered 2020-02-16: 13:00:00 50 ug via INTRAVENOUS
  Filled 2020-02-16: qty 2

## 2020-02-16 MED ORDER — PROPOFOL 10 MG/ML IV BOLUS
INTRAVENOUS | Status: DC | PRN
  Administered 2020-02-16: 150 mg via INTRAVENOUS

## 2020-02-16 MED ORDER — OXYCODONE HCL 5 MG PO TABS: 5.0000 mg | ORAL_TABLET | ORAL | 0 refills | Status: AC | PRN

## 2020-02-16 MED ORDER — SUGAMMADEX SODIUM 500 MG/5ML IV SOLN
INTRAVENOUS | Status: DC | PRN
  Administered 2020-02-16: 300 mg via INTRAVENOUS

## 2020-02-16 MED ORDER — OXYCODONE HCL 5 MG PO TABS
5.0000 mg | ORAL_TABLET | Freq: Once | ORAL | Status: AC | PRN
  Administered 2020-02-16: 5 mg via ORAL

## 2020-02-16 SURGICAL SUPPLY — 50 items
ANCH SUT 2 2.9 2 LD TPR NDL (Anchor) ×1 IMPLANT
ANCH SUT 2 2/0 ABS BRD STRL (Anchor) ×1 IMPLANT
ANCHOR JUGGERKNOT WTAP NDL 2.9 (Anchor) ×2 IMPLANT
ANCHOR SUT W/ ORTHOCORD (Anchor) ×2 IMPLANT
APL PRP STRL LF DISP 70% ISPRP (MISCELLANEOUS) ×1
BIT DRILL JUGRKNT W/NDL BIT2.9 (DRILL) IMPLANT
BLADE FULL RADIUS 3.5 (BLADE) ×3 IMPLANT
BUR ACROMIONIZER 4.0 (BURR) ×3 IMPLANT
CANNULA SHAVER 8MMX76MM (CANNULA) ×5 IMPLANT
CHLORAPREP W/TINT 26 (MISCELLANEOUS) ×3 IMPLANT
COVER MAYO STAND REUSABLE (DRAPES) ×3 IMPLANT
COVER WAND RF STERILE (DRAPES) ×3 IMPLANT
DRAPE IMP U-DRAPE 54X76 (DRAPES) ×6 IMPLANT
DRILL JUGGERKNOT W/NDL BIT 2.9 (DRILL) ×3
ELECT CAUTERY BLADE TIP 2.5 (TIP) ×3
ELECT REM PT RETURN 9FT ADLT (ELECTROSURGICAL) ×3
ELECTRODE CAUTERY BLDE TIP 2.5 (TIP) ×1 IMPLANT
ELECTRODE REM PT RTRN 9FT ADLT (ELECTROSURGICAL) ×1 IMPLANT
GAUZE SPONGE 4X4 12PLY STRL (GAUZE/BANDAGES/DRESSINGS) ×3 IMPLANT
GAUZE XEROFORM 1X8 LF (GAUZE/BANDAGES/DRESSINGS) ×3 IMPLANT
GLOVE BIO SURGEON STRL SZ7.5 (GLOVE) ×6 IMPLANT
GLOVE BIO SURGEON STRL SZ8 (GLOVE) ×6 IMPLANT
GLOVE BIOGEL PI IND STRL 8 (GLOVE) ×1 IMPLANT
GLOVE BIOGEL PI INDICATOR 8 (GLOVE) ×2
GLOVE INDICATOR 8.0 STRL GRN (GLOVE) ×3 IMPLANT
GOWN STRL REUS W/ TWL LRG LVL3 (GOWN DISPOSABLE) ×1 IMPLANT
GOWN STRL REUS W/ TWL XL LVL3 (GOWN DISPOSABLE) ×1 IMPLANT
GOWN STRL REUS W/TWL LRG LVL3 (GOWN DISPOSABLE) ×3
GOWN STRL REUS W/TWL XL LVL3 (GOWN DISPOSABLE) ×3
GRASPER SUT 15 45D LOW PRO (SUTURE) ×2 IMPLANT
IV LACTATED RINGER IRRG 3000ML (IV SOLUTION) ×6
IV LR IRRIG 3000ML ARTHROMATIC (IV SOLUTION) ×2 IMPLANT
KIT CANNULA 8X76-LX IN CANNULA (CANNULA) IMPLANT
MANIFOLD NEPTUNE II (INSTRUMENTS) ×6 IMPLANT
MASK FACE SPIDER DISP (MASK) ×3 IMPLANT
MAT ABSORB  FLUID 56X50 GRAY (MISCELLANEOUS) ×3
MAT ABSORB FLUID 56X50 GRAY (MISCELLANEOUS) ×1 IMPLANT
PACK ARTHROSCOPY SHOULDER (MISCELLANEOUS) ×3 IMPLANT
PASSER SUT FIRSTPASS SELF (INSTRUMENTS) IMPLANT
SLING ARM LRG DEEP (SOFTGOODS) ×1 IMPLANT
SLING ULTRA II LG (MISCELLANEOUS) ×1 IMPLANT
STAPLER SKIN PROX 35W (STAPLE) ×3 IMPLANT
STRAP SAFETY 5IN WIDE (MISCELLANEOUS) ×3 IMPLANT
SUT ETHIBOND 0 MO6 C/R (SUTURE) ×3 IMPLANT
SUT ULTRABRAID 2 COBRAID 38 (SUTURE) IMPLANT
SUT VIC AB 2-0 CT1 27 (SUTURE) ×6
SUT VIC AB 2-0 CT1 TAPERPNT 27 (SUTURE) ×2 IMPLANT
TAPE MICROFOAM 4IN (TAPE) ×3 IMPLANT
TUBING ARTHRO INFLOW-ONLY STRL (TUBING) ×3 IMPLANT
WAND WEREWOLF FLOW 90D (MISCELLANEOUS) ×3 IMPLANT

## 2020-02-16 NOTE — Transfer of Care (Signed)
Immediate Anesthesia Transfer of Care Note  Patient: EMMERSEN GARRAWAY  Procedure(s) Performed: SHOULDER ARTHROSCOPY WITH DEBRIDEMENT, DECOMPRESSION, ROTATOR CUFF REPAIR AND PROBABLE BICEPS TENODESIS (Right Shoulder)  Patient Location: PACU  Anesthesia Type:General  Level of Consciousness: drowsy and patient cooperative  Airway & Oxygen Therapy: Patient Spontanous Breathing and Patient connected to face mask oxygen  Post-op Assessment: Report given to RN and Post -op Vital signs reviewed and stable  Post vital signs: Reviewed and stable  Last Vitals:  Vitals Value Taken Time  BP 138/88 02/16/20 1535  Temp 36.4 C 02/16/20 1535  Pulse 96 02/16/20 1543  Resp 20 02/16/20 1543  SpO2 98 % 02/16/20 1543  Vitals shown include unvalidated device data.  Last Pain:  Vitals:   02/16/20 1138  TempSrc: Temporal  PainSc: 0-No pain         Complications: No complications documented.

## 2020-02-16 NOTE — Discharge Instructions (Addendum)
Orthopedic discharge instructions: °Keep dressing dry and intact.  °May shower after dressing changed on post-op day #4 (Saturday).  °Cover staples with Band-Aids after drying off. °Apply ice frequently to shoulder. °Take ibuprofen 600-800 mg TID with meals for 7-10 days, then as necessary. °Take oxycodone as prescribed when needed.  °May supplement with ES Tylenol if necessary. °Keep shoulder immobilizer on at all times except may remove for bathing purposes. °Follow-up in 10-14 days or as scheduled. ° ° °AMBULATORY SURGERY  °DISCHARGE INSTRUCTIONS ° ° °1) The drugs that you were given will stay in your system until tomorrow so for the next 24 hours you should not: ° °A) Drive an automobile °B) Make any legal decisions °C) Drink any alcoholic beverage ° ° °2) You may resume regular meals tomorrow.  Today it is better to start with liquids and gradually work up to solid foods. ° °You may eat anything you prefer, but it is better to start with liquids, then soup and crackers, and gradually work up to solid foods. ° ° °3) Please notify your doctor immediately if you have any unusual bleeding, trouble breathing, redness and pain at the surgery site, drainage, fever, or pain not relieved by medication. ° ° ° °4) Additional Instructions: ° ° ° ° ° ° ° °Please contact your physician with any problems or Same Day Surgery at [phone], Monday through Friday 6 am to 4 pm, or [place] [facility] [place] Main number at [phone]. °

## 2020-02-16 NOTE — Op Note (Signed)
02/16/2020  3:27 PM  Patient:   Sheena Ruiz  Pre-Op Diagnosis:   Impingement/tendinopathy with partial-thickness rotator cuff tear and biceps tendinopathy, right shoulder.  Post-Op Diagnosis:   Same.  Procedure:   Extensive arthroscopic debridement, arthroscopic subacromial decompression, arthroscopic repair of partial-thickness subscapularis tendon tear, and mini-open biceps tenodesis, right shoulder.  Anesthesia:   General endotracheal with interscalene block using Exparel placed preoperatively by the anesthesiologist.  Surgeon:   Pascal Lux, MD  Assistant:   Cameron Proud, PA-C  Findings:   As above.  There was an articular sided partial-thickness tear of the superior insertional fibers of the subscapularis tendon.  The remainder the rotator cuff was in satisfactory condition.  There was moderate labral fraying anteriorly and superiorly without frank detachment from the glenoid rim.  The biceps tendon demonstrated evidence of significant "lip sticking" without partial or full-thickness tears.  There was a focal area of grade 3 chondromalacial changes involving the anterior superior humeral head.  Otherwise, the articular surfaces of both the glenoid and humerus were in satisfactory condition  Complications:   None  Fluids:   750 cc  Estimated blood loss:   5 cc  Tourniquet time:   None  Drains:   None  Closure:   Staples      Brief clinical note:   The patient is a 59 year old female with a history of gradually worsening right shoulder pain. The patient's symptoms have progressed despite medications, activity modification, etc. The patient's history and examination are consistent with impingement/tendinopathy with a rotator cuff tear. These findings were confirmed by MRI scan. The patient presents at this time for definitive management of these shoulder symptoms.  Procedure:   The patient underwent placement of an interscalene block using Exparel by the  anesthesiologist in the preoperative holding area before being brought into the operating room and lain in the supine position. The patient then underwent general endotracheal intubation and anesthesia before being repositioned in the beach chair position using the beach chair positioner. The right shoulder and upper extremity were prepped with ChloraPrep solution before being draped sterilely. Preoperative antibiotics were administered. A timeout was performed to confirm the proper surgical site before the expected portal sites and incision site were injected with 0.5% Sensorcaine with epinephrine.   A posterior portal was created and the glenohumeral joint thoroughly inspected with the findings as described above. An anterior portal was created using an outside-in technique. The labrum and rotator cuff were further probed, again confirming the above-noted findings. The areas of labral fraying were debrided back to stable margins using the full-radius resector, as were areas of synovitis anteriorly, superiorly, and posteriorly. In addition, the area of focal grade 3 articular cartilage delamination on the anterosuperior portion of the humeral head was debrided back to stable margins using the full-radius resector. Finally, the torn portion of the subscapularis tendon was debrided back to stable margins using the full-radius resector. The ArthroCare wand was inserted and used to release the biceps tendon from its labral anchor. It also was used to obtain hemostasis as well as to "anneal" the labrum superiorly and anteriorly. The instruments were removed from the joint after suctioning the excess fluid.  The camera was repositioned through the posterior portal into the subacromial space. A separate lateral portal was created using an outside-in technique. The 3.5 mm full-radius resector was introduced and used to perform a subtotal bursectomy. The ArthroCare wand was then inserted and used to remove the  periosteal tissue off the undersurface  of the anterior third of the acromion as well as to recess the coracoacromial ligament from its attachment along the anterior and lateral margins of the acromion. The 4.0 mm acromionizing bur was introduced and used to complete the decompression by removing the undersurface of the anterior third of the acromion. The full radius resector was reintroduced to remove any residual bony debris before the ArthroCare wand was reintroduced to obtain hemostasis. The instruments were then removed from the subacromial space after suctioning the excess fluid.  An approximately 4-5 cm incision was made over the anterolateral aspect of the shoulder beginning at the anterolateral corner of the acromion and extending distally in line with the bicipital groove. This incision was carried down through the subcutaneous tissues to expose the deltoid fascia. The raphae between the anterior and middle thirds was identified and this plane developed to provide access into the subacromial space. Additional bursal tissues were debrided sharply using Metzenbaum scissors. The bursal surface of the rotator cuff was carefully inspected, again with the findings as described above.  The bicipital groove was identified by palpation and opened for 1-1.5 cm. The biceps tendon stump was retrieved through this defect. The floor of the bicipital groove was roughened with a curet before a single Biomet 2.9 mm JuggerKnot anchor was inserted. Both sets of sutures were passed through the biceps tendon and tied securely to effect the tenodesis. The bicipital sheath was reapproximated using one #0 Ethibond interrupted sutures, incorporating the biceps tendon to further reinforce the tenodesis.  The wound was copiously irrigated with sterile saline solution before the deltoid raphae was reapproximated using 2-0 Vicryl interrupted sutures. The subcutaneous tissues were closed in two layers using 2-0 Vicryl interrupted  sutures before the skin was closed using staples. The portal sites also were closed using staples. A sterile bulky dressing was applied to the shoulder before the arm was placed into a shoulder immobilizer. The patient was then awakened, extubated, and returned to the recovery room in satisfactory condition after tolerating the procedure well.

## 2020-02-16 NOTE — Anesthesia Procedure Notes (Signed)
Procedure Name: Intubation Date/Time: 02/16/2020 2:14 PM Performed by: Deri Fuelling, CRNA Pre-anesthesia Checklist: Patient identified, Emergency Drugs available, Suction available and Patient being monitored Patient Re-evaluated:Patient Re-evaluated prior to induction Oxygen Delivery Method: Circle system utilized Preoxygenation: Pre-oxygenation with 100% oxygen Induction Type: IV induction Ventilation: Mask ventilation without difficulty Laryngoscope Size: McGraph and 3 Grade View: Grade I Tube type: Oral Number of attempts: 1 Airway Equipment and Method: Stylet and Oral airway Placement Confirmation: ETT inserted through vocal cords under direct vision,  positive ETCO2 and breath sounds checked- equal and bilateral Tube secured with: Tape Dental Injury: Teeth and Oropharynx as per pre-operative assessment

## 2020-02-16 NOTE — Anesthesia Preprocedure Evaluation (Addendum)
Anesthesia Evaluation  Patient identified by MRN, date of birth, ID band Patient awake    Reviewed: Allergy & Precautions, H&P , NPO status , Patient's Chart, lab work & pertinent test results  History of Anesthesia Complications (+) history of anesthetic complications ("hard to wake up")  Airway Mallampati: II  TM Distance: >3 FB     Dental  (+) Teeth Intact   Pulmonary sleep apnea , neg COPD,    breath sounds clear to auscultation       Cardiovascular hypertension, (-) angina(-) Past MI and (-) Cardiac Stents (-) dysrhythmias  Rhythm:regular Rate:Normal     Neuro/Psych  Headaches, PSYCHIATRIC DISORDERS Depression    GI/Hepatic Neg liver ROS, hiatal hernia, GERD  ,  Endo/Other  diabetes  Renal/GU      Musculoskeletal  (+) Arthritis ,   Abdominal   Peds  Hematology negative hematology ROS (+)   Anesthesia Other Findings Past Medical History: No date: Arthritis No date: Cancer (HCC)     Comment:  LEFT BREAST 1990'S: Complication of anesthesia     Comment:  HARD TO WAKE UP X1 No date: Depression No date: Diabetes mellitus     Comment:  TYPE 2 No date: Family history of breast cancer No date: Family history of colon cancer No date: Family history of ovarian cancer No date: Family history of uterine cancer No date: GERD (gastroesophageal reflux disease)     Comment:  OCC No date: Headache     Comment:  H/O MIGRAINES No date: Hearing loss     Comment:  RIGHT EAR No date: History of hiatal hernia     Comment:  SMALL No date: History of kidney stones     Comment:  H/O No date: Hyperlipidemia No date: Hypertension No date: Sleep apnea     Comment:  MILD NO CPAP  Past Surgical History: 03/21/2018: BREAST BIOPSY; Left     Comment:  US guided core needle biopsy No date: BREAST SURGERY     Comment:  LUMPECTOMY 12/24/2019: CARPAL TUNNEL RELEASE; Right     Comment:  Procedure: CARPAL TUNNEL RELEASE  ENDOSCOPIC;  Surgeon:               Christena Flake, MD;  Location: [facility];  Service:               Orthopedics;  Laterality: Right; 2003: CESAREAN SECTION 1997: CHOLECYSTECTOMY 2008, 2013,2020: COLONOSCOPY 09/24/2014: FOREIGN BODY REMOVAL EAR; Right     Comment:  Procedure: REMOVAL FOREIGN BODY EAR EUA;  Surgeon:               Linus Salmons, MD;  Location: [facility];                Service: ENT;  Laterality: Right;  DIABETIC-ORAL MEDS,               EUA bilaterally 08/31/2019: HYSTEROSCOPY WITH D & C; N/A     Comment:  Procedure: DILATATION AND CURETTAGE /HYSTEROSCOPY;                Surgeon: Christeen Douglas, MD;  Location: [facility];                Service: Gynecology;  Laterality: N/A; 05/13/2018: MASTECTOMY, PARTIAL; Left 2005: NASAL SEPTUM SURGERY     Comment:  fungal mass removed 2012: TOTAL ANKLE ARTHROPLASTY No date: TOTAL ANKLE REPLACEMENT No date: TUBAL LIGATION     Reproductive/Obstetrics negative OB ROS  Anesthesia Physical Anesthesia Plan  ASA: II  Anesthesia Plan: General ETT   Post-op Pain Management: GA combined w/ Regional for post-op pain   Induction:   PONV Risk Score and Plan: Ondansetron, Dexamethasone, Midazolam and Treatment may vary due to age or medical condition  Airway Management Planned:   Additional Equipment:   Intra-op Plan:   Post-operative Plan:   Informed Consent: I have reviewed the patients History and Physical, chart, labs and discussed the procedure including the risks, benefits and alternatives for the proposed anesthesia with the patient or authorized representative who has indicated his/her understanding and acceptance.     Dental Advisory Given  Plan Discussed with: Anesthesiologist, CRNA and Surgeon  Anesthesia Plan Comments:         Anesthesia Quick Evaluation

## 2020-02-16 NOTE — H&P (Signed)
History of Present Illness:  Sheena Ruiz is a 59 y.o. female who presents for follow-up of her persistent right shoulder pain secondary to an MRI documented partial-thickness tear involving the superior portion of the subscapularis tendon with medial subluxation of the biceps tendon. Tendinopathic changes of the supraspinatus and infraspinatus tendon also were noted by MRI scan. The patient had been seen for the symptoms 2.5 months ago. At the time, surgical intervention was discussed, the but the patient had elected to hold off at that time. She feels that her symptoms have persisted if not worsened since then. She has pain with activities at or above shoulder level, as well as if she sleeps on her right side at night. She denies any further injury to the shoulder, and denies any numbness or paresthesias down her arm to her hand. She is ready to consider more aggressive treatment options at this time.  Current Outpatient Medications: . acetaminophen (TYLENOL) 500 MG tablet Take 1,000 mg by mouth as needed for Pain  . ALPRAZolam (XANAX) 0.5 MG tablet Take 1 tablet (0.5 mg total) by mouth as needed for Anxiety (take 1-2 tablets 30-45 min prior to MRI) for up to 2 doses 2 tablet 0  . EFFEXOR XR 150 mg XR capsule TAKE 1 CAPSULE BY MOUTH ONCE DAILY (Patient taking differently: Take 150 mg by mouth once daily ) 30 capsule 5  . estradioL (ESTRACE) 0.01 % (0.1 mg/gram) vaginal cream Insert pea sized amount vaginally at night twice weekly for maintenance 42.5 g 1  . metFORMIN (GLUCOPHAGE-XR) 500 MG XR tablet TAKE 4 TABLETS BY MOUTH EVERY MORNING WITH BREAKFAST  . multivitamin tablet Take 1 tablet by mouth every morning  . naproxen sodium (ALEVE) 220 MG tablet Take 440 mg by mouth as needed for Pain  . olmesartan-hydrochlorothiazide (BENICAR HCT) 20-12.5 mg tablet TAKE 1 TABLET BY MOUTH ONCE DAILY (Patient taking differently: Take 1 tablet by mouth every morning ) 30 tablet 5   Allergies:  . Doxycycline  Rash  . Dulaglutide Abdominal Pain, Diarrhea and Nausea  GI Upset . Empagliflozin Other (See Comments)  Yeast infections  . Fenofibrate Micronized Rash  "felt bad and heavy."  . Penicillins Rash  Pt has tolerated oral Cephalosporins in past   Past Medical History:  . Anesthesia complication  fast heart rate only in 2001  . Depression  . Diabetes mellitus type 2, uncomplicated (CMS-HCC)  . Hyperlipidemia  . Hypertension  . Malignant neoplasm of upper-inner quadrant of left breast in female, estrogen receptor positive (CMS-HCC) 03/05/2018   Past Surgical History:  . BIOPSY/EXCISION LYMPH NODE AXILLARY Left 05/13/2018  Procedure: BIOPSY OR EXCISION OF LYMPH NODE(S); OPEN, DEEP AXILLARY NODE(S); Surgeon: Johnna Acosta, MD; Location: [facility]; Service: General Surgery; Laterality: Left;  . CESAREAN SECTION 2003  . CHOLECYSTECTOMY 1997  . COLONOSCOPY 09/19/2011, 12/27/2006  Adenomatous Polyp, FHCC (Father), FH Colon Polyps (Father): CBF 08/2016; Recall Ltr mailed 08/03/2016 (dw)  . COLONOSCOPY 04/22/2018  Adenomatous Polyp; [place] (Father) CBF 04/2023  . Endoscopic right carpal tunnel release. Right 12/24/2019  Dr. Roland Rack  . foreign body removal Right  . MASTECTOMY PARTIAL Left 05/13/2018  Procedure: MASTECTOMY, PARTIAL [place]; Surgeon: Johnna Acosta, MD; Location: [facility]; Service: General Surgery; Laterality: Left;  . Nasal Surgery 2005  fungal mass removed; deviated septum  . OTHER SURGICAL HISTORY 2001  ankle surgery  . PERCUTANEOUS BIOPSY BREAST Left 03/21/2018  03/21/2018: Left breast US-guided core needle biopsy of the mass at 11:30 (ribbon clip placed/verified) -  invasive ductal carcinoma, grade 1, ER+/PR+/HER2-  . TOTAL ANKLE REPLACEMENT 2012  . TUBAL LIGATION   Family History:  . Depression Mother  . High blood pressure (Hypertension) Mother  . Osteoporosis (Thinning of bones) Mother  . Cervical cancer Mother  . Uterine cancer Mother  . Depression Father  .  Prostate cancer Father  . Stroke Father  . High blood pressure (Hypertension) Father  . Colon cancer Father  . Colon polyps Father  . High blood pressure (Hypertension) Sister  . High blood pressure (Hypertension) Brother  . Depression Brother  . High blood pressure (Hypertension) Maternal Grandmother  . Stroke Maternal Grandmother  . Diabetes type II Maternal Grandmother  . Allergic rhinitis Maternal Grandmother  . Asthma Maternal Grandmother  . Osteoporosis (Thinning of bones) Maternal Grandmother  . Depression Maternal Grandmother  . Myocardial Infarction (Heart attack) Maternal Grandfather  . High blood pressure (Hypertension) Maternal Grandfather  . Allergic rhinitis Maternal Grandfather  . Asthma Maternal Grandfather  . Depression Maternal Grandfather  . Myocardial Infarction (Heart attack) Paternal Grandmother  . High blood pressure (Hypertension) Paternal Grandmother  . Stroke Paternal Grandmother  . Diabetes type II Paternal Grandmother  . Depression Paternal Grandmother  . Osteoporosis (Thinning of bones) Paternal Grandmother  . Myocardial Infarction (Heart attack) Paternal Grandfather  . High blood pressure (Hypertension) Paternal Grandfather  . Depression Paternal Grandfather  . Breast cancer Cousin  . Breast cancer Cousin  . Breast cancer Paternal Aunt  . Ovarian cancer Paternal Aunt  . Colon cancer Paternal Aunt  . Lung cancer Maternal Uncle  . Colon cancer Maternal Uncle  . Lung cancer Maternal Uncle  . Liver cancer Maternal Uncle  . Anesthesia problems Neg Hx   Social History:   Socioeconomic History:  Marland Kitchen Marital status: Married  Spouse name: Not on file  . Number of children: 2  . Years of education: [employer]  . Highest education level: Not on file  Occupational History  . Occupation: Retired  Tobacco Use  . Smoking status: Never Smoker  . Smokeless tobacco: Never Used  Vaping Use  . Vaping Use: Never used  Substance and Sexual Activity  .  Alcohol use: Yes  Alcohol/week: 0.0 standard drinks  Comment: "Rarely"  . Drug use: Never  . Sexual activity: Yes  Partners: Male  Birth control/protection: Post-menopausal  Other Topics Concern  . Not on file  Social History Narrative  Preferred pronouns: She/Her/Hers  Living Arrangements: The patient is married and lives in [place]. She homeschools her teenage son.  Her husband is a family Engineer, petroleum in [place].  Education: Masters for religious history.  Occupation: Retired  Religious preference: Protestant  Exercise: walk  Diet: diabetic   Social Determinants of Radio broadcast assistant Strain: Not on file  Food Insecurity: Not on file  Transportation Needs: Not on file   Review of Systems:  A comprehensive 14 point ROS was performed, reviewed, and the pertinent orthopaedic findings are documented in the HPI.  Physical Exam: Vitals:  02/08/20 0946  BP: 140/80  Weight: 90.4 kg (199 lb 3.2 oz)  Height: 162.6 cm (_0 )  PainSc: 0-No pain  PainLoc: Hand   General/Constitutional: The patient appears to be well-nourished, well-developed, and in no acute distress. Neuro/Psych: Normal mood and affect, oriented to person, place and time. Eyes: Non-icteric. Pupils are equal, round, and reactive to light, and exhibit synchronous movement. ENT: Unremarkable. Lymphatic: No palpable adenopathy. Respiratory: Lungs clear to auscultation, Normal chest excursion, No wheezes and  Non-labored breathing Cardiovascular: Regular rate and rhythm. No murmurs. and No edema, swelling or tenderness, except as noted in detailed exam. Integumentary: No impressive skin lesions present, except as noted in detailed exam. Musculoskeletal: Unremarkable, except as noted in detailed exam.  Rightshoulder exam: SKIN:Normal SWELLING:None WARMTH:None LYMPH NODES:No adenopathy palpable CREPITUS:Mild soft tissue crepitance in the subacromial region TENDERNESS:Mild tendernessover  anterolateral shoulder ROM (active):  Forward flexion:155 degrees Abduction: 150 degrees Internal rotation: L1 ROM (passive):  Forward flexion: 160 degrees Abduction: 155 degrees ER/IR at 90 abd: 90 degrees/50 degrees  She experiencesmild-moderate pain with forward flexion and abduction, and mild pain with internal rotation.  STRENGTH: Forward flexion: 4/5 Abduction: 4/5 External rotation: 4-4+/5 Internal rotation: 4+/5 Pain with RC testing: Mild pain with resistedforward flexion more so than with resistedabduction  STABILITY:Normal  SPECIAL TESTS: Luan Pulling' test:Mildlypositive Speed's test:Positive Capsulitis - pain w/ passive ER:No Crossed arm test: Mildly positive Crank: Not evaluated Anterior apprehension: Negative Posterior apprehension: Not evaluated  She is neurovascularly intact to all digits.  Assessment: . Tendinitis of upper biceps tendon of right shoulder  . Rotator cuff tendinitis, right  . Nontraumatic incomplete tear of right rotator cuff   Plan: The treatment options were discussed with the patient. In addition, patient educational materials were provided regarding the diagnosis and treatment options. Regarding her right shoulder symptoms, the patient has become increasingly frustrated by her persistent persistent symptoms and functional limitations and is ready to now proceed with surgical intervention, specifically a right shoulder arthroscopy with debridement, decompression, rotator cuff repair, and biceps tenodesis. The procedure was discussed with the patient, as were the potential risks (including bleeding,  infection, nerve and/or blood vessel injury, persistent or recurrent pain, failure of the repair, progression of arthritis, need for further surgery, blood clots, strokes, heart attacks and/or arhythmias, pneumonia, etc.) and benefits. The patient states her understanding and wishes to proceed. All of the patient's questions and concerns were answered. She can call any time with further concerns. She will follow up post-surgery, routine.   H&P reviewed and patient re-examined. No changes.

## 2020-02-17 ENCOUNTER — Encounter: Payer: Self-pay | Admitting: Surgery

## 2020-02-17 NOTE — Anesthesia Postprocedure Evaluation (Signed)
Anesthesia Post Note  Patient: Sheena Ruiz  Procedure(s) Performed: SHOULDER ARTHROSCOPY WITH DEBRIDEMENT, DECOMPRESSION, ROTATOR CUFF REPAIR AND PROBABLE BICEPS TENODESIS (Right Shoulder)  Patient location during evaluation: PACU Anesthesia Type: General Level of consciousness: awake and alert Pain management: pain level controlled Vital Signs Assessment: post-procedure vital signs reviewed and stable Respiratory status: spontaneous breathing, nonlabored ventilation and respiratory function stable Cardiovascular status: blood pressure returned to baseline and stable Postop Assessment: no apparent nausea or vomiting Anesthetic complications: no   No complications documented.   Last Vitals:  Vitals:   02/16/20 1656 02/16/20 1730  BP: 123/72 116/70  Pulse: 100 64  Resp: 16 16  Temp: 36.6 C 36.5 C  SpO2: 95% 100%    Last Pain:  Vitals:   02/17/20 0918  TempSrc:   PainSc: 1                  Karleen Hampshire

## 2020-02-22 DIAGNOSIS — M6281 Muscle weakness (generalized): Secondary | ICD-10-CM | POA: Diagnosis not present

## 2020-02-22 DIAGNOSIS — Z9889 Other specified postprocedural states: Secondary | ICD-10-CM | POA: Diagnosis not present

## 2020-02-22 DIAGNOSIS — M25611 Stiffness of right shoulder, not elsewhere classified: Secondary | ICD-10-CM | POA: Diagnosis not present

## 2020-02-22 DIAGNOSIS — M25511 Pain in right shoulder: Secondary | ICD-10-CM | POA: Diagnosis not present

## 2020-02-29 DIAGNOSIS — M25511 Pain in right shoulder: Secondary | ICD-10-CM | POA: Diagnosis not present

## 2020-02-29 DIAGNOSIS — Z9889 Other specified postprocedural states: Secondary | ICD-10-CM | POA: Diagnosis not present

## 2020-03-14 DIAGNOSIS — Z9889 Other specified postprocedural states: Secondary | ICD-10-CM | POA: Diagnosis not present

## 2020-03-28 ENCOUNTER — Other Ambulatory Visit: Payer: Self-pay | Admitting: Family Medicine

## 2020-03-28 ENCOUNTER — Encounter: Payer: Self-pay | Admitting: Family Medicine

## 2020-03-28 NOTE — Telephone Encounter (Signed)
Ok to refill??  Last office visit 04/29/2019.  Last refill 09/22/2019, #2 refills.   Of note, she has not established with new provider at this time. She does have a new patient appointment scheduled.

## 2020-04-04 DIAGNOSIS — M25611 Stiffness of right shoulder, not elsewhere classified: Secondary | ICD-10-CM | POA: Diagnosis not present

## 2020-04-04 DIAGNOSIS — M6281 Muscle weakness (generalized): Secondary | ICD-10-CM | POA: Diagnosis not present

## 2020-04-04 DIAGNOSIS — Z9889 Other specified postprocedural states: Secondary | ICD-10-CM | POA: Diagnosis not present

## 2020-04-04 DIAGNOSIS — M25511 Pain in right shoulder: Secondary | ICD-10-CM | POA: Diagnosis not present

## 2020-04-12 DIAGNOSIS — M25511 Pain in right shoulder: Secondary | ICD-10-CM | POA: Diagnosis not present

## 2020-04-12 DIAGNOSIS — M6281 Muscle weakness (generalized): Secondary | ICD-10-CM | POA: Diagnosis not present

## 2020-04-12 DIAGNOSIS — M25611 Stiffness of right shoulder, not elsewhere classified: Secondary | ICD-10-CM | POA: Diagnosis not present

## 2020-04-12 DIAGNOSIS — Z9889 Other specified postprocedural states: Secondary | ICD-10-CM | POA: Diagnosis not present

## 2020-04-14 DIAGNOSIS — Z9889 Other specified postprocedural states: Secondary | ICD-10-CM | POA: Diagnosis not present

## 2020-04-18 DIAGNOSIS — M25611 Stiffness of right shoulder, not elsewhere classified: Secondary | ICD-10-CM | POA: Diagnosis not present

## 2020-04-18 DIAGNOSIS — Z9889 Other specified postprocedural states: Secondary | ICD-10-CM | POA: Diagnosis not present

## 2020-04-20 DIAGNOSIS — M25611 Stiffness of right shoulder, not elsewhere classified: Secondary | ICD-10-CM | POA: Diagnosis not present

## 2020-04-20 DIAGNOSIS — M25511 Pain in right shoulder: Secondary | ICD-10-CM | POA: Diagnosis not present

## 2020-04-20 DIAGNOSIS — M6281 Muscle weakness (generalized): Secondary | ICD-10-CM | POA: Diagnosis not present

## 2020-04-20 DIAGNOSIS — Z9889 Other specified postprocedural states: Secondary | ICD-10-CM | POA: Diagnosis not present

## 2020-04-21 DIAGNOSIS — Z9889 Other specified postprocedural states: Secondary | ICD-10-CM | POA: Diagnosis not present

## 2020-04-21 DIAGNOSIS — Z853 Personal history of malignant neoplasm of breast: Secondary | ICD-10-CM | POA: Diagnosis not present

## 2020-04-21 DIAGNOSIS — Z1239 Encounter for other screening for malignant neoplasm of breast: Secondary | ICD-10-CM | POA: Diagnosis not present

## 2020-04-25 DIAGNOSIS — M25511 Pain in right shoulder: Secondary | ICD-10-CM | POA: Diagnosis not present

## 2020-04-25 DIAGNOSIS — Z9889 Other specified postprocedural states: Secondary | ICD-10-CM | POA: Diagnosis not present

## 2020-04-25 DIAGNOSIS — M6281 Muscle weakness (generalized): Secondary | ICD-10-CM | POA: Diagnosis not present

## 2020-04-25 DIAGNOSIS — M25611 Stiffness of right shoulder, not elsewhere classified: Secondary | ICD-10-CM | POA: Diagnosis not present

## 2020-04-28 DIAGNOSIS — M6281 Muscle weakness (generalized): Secondary | ICD-10-CM | POA: Diagnosis not present

## 2020-04-28 DIAGNOSIS — Z9889 Other specified postprocedural states: Secondary | ICD-10-CM | POA: Diagnosis not present

## 2020-04-28 DIAGNOSIS — M25511 Pain in right shoulder: Secondary | ICD-10-CM | POA: Diagnosis not present

## 2020-04-28 DIAGNOSIS — M25611 Stiffness of right shoulder, not elsewhere classified: Secondary | ICD-10-CM | POA: Diagnosis not present

## 2020-05-02 DIAGNOSIS — M6281 Muscle weakness (generalized): Secondary | ICD-10-CM | POA: Diagnosis not present

## 2020-05-02 DIAGNOSIS — M25511 Pain in right shoulder: Secondary | ICD-10-CM | POA: Diagnosis not present

## 2020-05-02 DIAGNOSIS — Z9889 Other specified postprocedural states: Secondary | ICD-10-CM | POA: Diagnosis not present

## 2020-05-02 DIAGNOSIS — M25611 Stiffness of right shoulder, not elsewhere classified: Secondary | ICD-10-CM | POA: Diagnosis not present

## 2020-05-02 DIAGNOSIS — E119 Type 2 diabetes mellitus without complications: Secondary | ICD-10-CM | POA: Diagnosis not present

## 2020-05-05 DIAGNOSIS — M6281 Muscle weakness (generalized): Secondary | ICD-10-CM | POA: Diagnosis not present

## 2020-05-05 DIAGNOSIS — Z9889 Other specified postprocedural states: Secondary | ICD-10-CM | POA: Diagnosis not present

## 2020-05-05 DIAGNOSIS — M25611 Stiffness of right shoulder, not elsewhere classified: Secondary | ICD-10-CM | POA: Diagnosis not present

## 2020-05-05 DIAGNOSIS — M25511 Pain in right shoulder: Secondary | ICD-10-CM | POA: Diagnosis not present

## 2020-05-11 DIAGNOSIS — Z7689 Persons encountering health services in other specified circumstances: Secondary | ICD-10-CM | POA: Diagnosis not present

## 2020-05-11 DIAGNOSIS — Z0000 Encounter for general adult medical examination without abnormal findings: Secondary | ICD-10-CM | POA: Diagnosis not present

## 2020-05-11 DIAGNOSIS — E782 Mixed hyperlipidemia: Secondary | ICD-10-CM | POA: Diagnosis not present

## 2020-05-11 DIAGNOSIS — G2581 Restless legs syndrome: Secondary | ICD-10-CM | POA: Diagnosis not present

## 2020-05-11 DIAGNOSIS — E119 Type 2 diabetes mellitus without complications: Secondary | ICD-10-CM | POA: Diagnosis not present

## 2020-05-12 DIAGNOSIS — M25611 Stiffness of right shoulder, not elsewhere classified: Secondary | ICD-10-CM | POA: Diagnosis not present

## 2020-05-12 DIAGNOSIS — Z9889 Other specified postprocedural states: Secondary | ICD-10-CM | POA: Diagnosis not present

## 2020-05-12 DIAGNOSIS — M25511 Pain in right shoulder: Secondary | ICD-10-CM | POA: Diagnosis not present

## 2020-05-12 DIAGNOSIS — M6281 Muscle weakness (generalized): Secondary | ICD-10-CM | POA: Diagnosis not present

## 2020-05-13 ENCOUNTER — Other Ambulatory Visit: Payer: Self-pay | Admitting: Family Medicine

## 2020-06-10 ENCOUNTER — Other Ambulatory Visit (HOSPITAL_COMMUNITY): Payer: Self-pay | Admitting: Neurology

## 2020-06-10 ENCOUNTER — Other Ambulatory Visit: Payer: Self-pay | Admitting: Neurology

## 2020-06-10 DIAGNOSIS — G43109 Migraine with aura, not intractable, without status migrainosus: Secondary | ICD-10-CM | POA: Diagnosis not present

## 2020-06-10 DIAGNOSIS — E559 Vitamin D deficiency, unspecified: Secondary | ICD-10-CM | POA: Diagnosis not present

## 2020-06-10 DIAGNOSIS — E538 Deficiency of other specified B group vitamins: Secondary | ICD-10-CM | POA: Diagnosis not present

## 2020-06-10 DIAGNOSIS — G43909 Migraine, unspecified, not intractable, without status migrainosus: Secondary | ICD-10-CM

## 2020-06-11 ENCOUNTER — Other Ambulatory Visit: Payer: Self-pay | Admitting: Family Medicine

## 2020-06-16 DIAGNOSIS — E119 Type 2 diabetes mellitus without complications: Secondary | ICD-10-CM | POA: Diagnosis not present

## 2020-06-16 DIAGNOSIS — E782 Mixed hyperlipidemia: Secondary | ICD-10-CM | POA: Diagnosis not present

## 2020-06-22 ENCOUNTER — Ambulatory Visit: Payer: 59

## 2020-06-24 ENCOUNTER — Other Ambulatory Visit: Payer: Self-pay | Admitting: Family Medicine

## 2020-06-27 ENCOUNTER — Emergency Department: Payer: 59

## 2020-06-27 ENCOUNTER — Emergency Department
Admission: EM | Admit: 2020-06-27 | Discharge: 2020-06-27 | Disposition: A | Discharge status: Home / Self Care | Payer: 59 | Attending: Emergency Medicine | Admitting: Emergency Medicine

## 2020-06-27 ENCOUNTER — Other Ambulatory Visit: Payer: Self-pay

## 2020-06-27 DIAGNOSIS — W108XXA Fall (on) (from) other stairs and steps, initial encounter: Secondary | ICD-10-CM | POA: Insufficient documentation

## 2020-06-27 DIAGNOSIS — I10 Essential (primary) hypertension: Secondary | ICD-10-CM | POA: Insufficient documentation

## 2020-06-27 DIAGNOSIS — S0083XA Contusion of other part of head, initial encounter: Secondary | ICD-10-CM | POA: Diagnosis not present

## 2020-06-27 DIAGNOSIS — Y92511 Restaurant or cafe as the place of occurrence of the external cause: Secondary | ICD-10-CM | POA: Insufficient documentation

## 2020-06-27 DIAGNOSIS — Z853 Personal history of malignant neoplasm of breast: Secondary | ICD-10-CM | POA: Insufficient documentation

## 2020-06-27 DIAGNOSIS — S0990XA Unspecified injury of head, initial encounter: Secondary | ICD-10-CM | POA: Diagnosis not present

## 2020-06-27 DIAGNOSIS — Z7984 Long term (current) use of oral hypoglycemic drugs: Secondary | ICD-10-CM | POA: Diagnosis not present

## 2020-06-27 DIAGNOSIS — W19XXXA Unspecified fall, initial encounter: Secondary | ICD-10-CM

## 2020-06-27 DIAGNOSIS — E119 Type 2 diabetes mellitus without complications: Secondary | ICD-10-CM | POA: Diagnosis not present

## 2020-06-27 DIAGNOSIS — S99912A Unspecified injury of left ankle, initial encounter: Secondary | ICD-10-CM | POA: Diagnosis present

## 2020-06-27 DIAGNOSIS — M25572 Pain in left ankle and joints of left foot: Secondary | ICD-10-CM | POA: Diagnosis not present

## 2020-06-27 DIAGNOSIS — S93402A Sprain of unspecified ligament of left ankle, initial encounter: Secondary | ICD-10-CM | POA: Insufficient documentation

## 2020-06-27 NOTE — ED Provider Notes (Signed)
[facility] Emergency Department Provider Note   ____________________________________________    I have reviewed the triage vital signs and the nursing notes.   HISTORY  Chief Complaint Fall     HPI Sheena Ruiz is a 60 y.o. female who presents for evaluation after a fall yesterday.  Patient reports she missed a step coming out of a restaurant, rolled her left ankle and fell down onto her right shoulder and struck the right side of her head.  No LOC although was dazed for.  After the head injury.  Has been using crutches and an Ace wrap on her left ankle.  Spoke with her PCP today who recommended evaluation at the emergency department.  No other injuries reported.  Not on blood thinners  Past Medical History:  Diagnosis Date  . Arthritis   . Cancer (HCC)    LEFT BREAST  . Complication of anesthesia 1990'S   HARD TO WAKE UP X1  . Depression   . Diabetes mellitus    TYPE 2  . Family history of breast cancer   . Family history of colon cancer   . Family history of ovarian cancer   . Family history of uterine cancer   . GERD (gastroesophageal reflux disease)    OCC  . Headache    H/O MIGRAINES  . Hearing loss    RIGHT EAR  . History of hiatal hernia    SMALL  . History of kidney stones    H/O  . Hyperlipidemia   . Hypertension   . Sleep apnea    MILD NO CPAP    Patient Active Problem List   Diagnosis Date Noted  . Chronic diarrhea 04/08/2019  . Genetic testing 04/14/2018  . Malignant neoplasm of upper-inner quadrant of left breast in female, estrogen receptor positive ([place]) 04/01/2018  . Family history of breast cancer   . Family history of ovarian cancer   . Family history of colon cancer   . Family history of uterine cancer   . Obesity (BMI 30.0-34.9) 07/01/2017  . Insomnia 04/23/2017  . Acute lateral meniscal tear, left, subsequent encounter 03/27/2017  . Chronic knee pain 03/27/2017  . It band syndrome, right 03/27/2017   . HLD (hyperlipidemia) 10/23/2012  . Colon polyps 07/02/2011  . Diabetes ([place])   . Depression   . Hypertension     Past Surgical History:  Procedure Laterality Date  . BREAST BIOPSY Left 03/21/2018   US guided core needle biopsy  . BREAST SURGERY     LUMPECTOMY  . CARPAL TUNNEL RELEASE Right 12/24/2019   Procedure: CARPAL TUNNEL RELEASE ENDOSCOPIC;  Surgeon: Corky Mull, MD;  Location: [facility] ORS;  Service: Orthopedics;  Laterality: Right;  . CESAREAN SECTION  2003  . CHOLECYSTECTOMY  1997  . COLONOSCOPY  2008, 2013,2020  . FOREIGN BODY REMOVAL EAR Right 09/24/2014   Procedure: REMOVAL FOREIGN BODY EAR EUA;  Surgeon: Beverly Gust, MD;  Location: [facility];  Service: ENT;  Laterality: Right;  DIABETIC-ORAL MEDS, EUA bilaterally  . HYSTEROSCOPY WITH D & C N/A 08/31/2019   Procedure: DILATATION AND CURETTAGE /HYSTEROSCOPY;  Surgeon: Benjaman Kindler, MD;  Location: [facility] ORS;  Service: Gynecology;  Laterality: N/A;  . MASTECTOMY, PARTIAL Left 05/13/2018  . NASAL SEPTUM SURGERY  2005   fungal mass removed  . SHOULDER ARTHROSCOPY WITH ROTATOR CUFF REPAIR AND OPEN BICEPS TENODESIS Right 02/16/2020   Procedure: SHOULDER ARTHROSCOPY WITH DEBRIDEMENT, DECOMPRESSION, ROTATOR CUFF REPAIR AND PROBABLE BICEPS TENODESIS;  Surgeon: Corky Mull, MD;  Location: [facility] ORS;  Service: Orthopedics;  Laterality: Right;  . TOTAL ANKLE ARTHROPLASTY  2012  . TOTAL ANKLE REPLACEMENT    . TUBAL LIGATION      Prior to Admission medications   Medication Sig Start Date End Date Taking? Authorizing Provider  ALPRAZolam Duanne Moron) 0.5 MG tablet TAKE 1 TABLET BY MOUTH AT BEDTIME AS NEEDED FOR ANXIETY 03/28/20   Alycia Rossetti, MD  Blood Glucose Monitoring Suppl (BLOOD GLUCOSE SYSTEM PAK) KIT Please dispense based on patient and insurance preference. Use as directed to monitor FSBS 2x daily. Dx: E11.9. 08/14/17   Alycia Rossetti, MD  EFFEXOR XR 150 MG 24 hr capsule Take 1 capsule (150 mg total) by mouth  daily. Patient taking differently: Take 150 mg by mouth daily with lunch. 06/25/19   Correll, Modena Nunnery, MD  glipiZIDE (GLUCOTROL) 5 MG tablet TAKE 1 AND 1/2 TABLETS(7.5 MG) BY MOUTH TWICE DAILY BEFORE A MEAL Patient taking differently: Take 10 mg by mouth 3 (three) times daily before meals. 12/11/19   Alycia Rossetti, MD  Lancet Devices MISC Please dispense based on patient and insurance preference. Use as directed to monitor FSBS 2x daily. Dx: E11.9. 03/27/17   Alycia Rossetti, MD  Lancets MISC Please dispense based on patient and insurance preference. Use as directed to monitor FSBS 2x daily. Dx: E11.9. 03/27/17   Alycia Rossetti, MD  metFORMIN (GLUCOPHAGE-XR) 500 MG 24 hr tablet TAKE 4 TABLETS BY MOUTH EVERY MORNING WITH BREAKFAST 05/13/20   Alycia Rossetti, MD  Multiple Vitamin (MULTIVITAMIN) tablet Take 1 tablet by mouth daily.     [provider]  olmesartan-hydrochlorothiazide (BENICAR HCT) 20-12.5 MG tablet TAKE 1 TABLET BY MOUTH DAILY IN THE MORNING Patient taking differently: Take 1 tablet by mouth every morning. 06/24/19   [place], Modena Nunnery, MD  oxyCODONE (ROXICODONE) 5 MG immediate release tablet Take 1-2 tablets (5-10 mg total) by mouth every 4 (four) hours as needed for moderate pain or severe pain. 02/16/20   Poggi, Marshall Cork, MD  Probiotic Product (PROBIOTIC DAILY PO) Take 1 tablet by mouth daily.     [provider]     Allergies Jardiance [empagliflozin], Tricor [fenofibrate], Trulicity [dulaglutide], Doxycycline, and Penicillins  Family History  Problem Relation Age of Onset  . Arthritis Mother   . Cancer Mother        SKIN  . Depression Mother   . Hypertension Mother   . Hyperlipidemia Mother   . Uterine cancer Mother        dx 11s  . Arthritis Father   . Cancer Father        SKIN  . Depression Father   . Stroke Father   . Hypertension Father   . Hyperlipidemia Father   . Prostate cancer Father        dx 4s  . Colon cancer Father        dx 44s   . Vision loss Sister   . Hyperlipidemia Sister   . Asthma Brother   . COPD Brother   . Diabetes Brother   . Vision loss Brother   . Hypertension Brother   . Hyperlipidemia Brother   . Arthritis Maternal Grandmother   . Diabetes Maternal Grandmother   . Hyperlipidemia Maternal Grandmother   . Heart disease Maternal Grandmother   . Arthritis Maternal Grandfather   . Diabetes Maternal Grandfather   . Hyperlipidemia Maternal Grandfather   . Heart disease Maternal Grandfather   .  Arthritis Paternal Grandmother   . Diabetes Paternal Grandmother   . Hyperlipidemia Paternal Grandmother   . Heart disease Paternal Grandmother   . Arthritis Paternal Grandfather   . Diabetes Paternal Grandfather   . Hyperlipidemia Paternal Grandfather   . Heart disease Paternal Grandfather   . Breast cancer Paternal Aunt        dx 59s  . Ovarian cancer Paternal Aunt        dx 41s  . Colon cancer Paternal Aunt   . Breast cancer Cousin        3 cousins, one dx at 68, one in her 59s, one in her 75s  . Colon cancer Maternal Uncle   . Lung cancer Maternal Uncle   . Breast cancer Maternal Aunt        dx 40s  . Breast cancer Cousin        4 cousins, one dx 69s, one dx 11, one dx 39, one dx 59    Social History Social History   Tobacco Use  . Smoking status: Never Smoker  . Smokeless tobacco: Never Used  Vaping Use  . Vaping Use: Never used  Substance Use Topics  . Alcohol use: Never    Alcohol/week: 1.0 standard drink    Types: 1 Glasses of wine per week  . Drug use: No    Review of Systems  Constitutional: No dizziness  ENT: No facial injury   Gastrointestinal: No abdominal pain.  No nausea, no vomiting.    Musculoskeletal: As above Skin: Bruising to the left foot Neurological: No acute neurodeficits    ____________________________________________   PHYSICAL EXAM:  VITAL SIGNS: ED Triage Vitals  Enc Vitals Group     BP 06/27/20 1000 121/72     Pulse Rate 06/27/20 1000 78      Resp 06/27/20 1000 18     Temp 06/27/20 1000 98.5 F (36.9 C)     Temp Source 06/27/20 1000 Oral     SpO2 06/27/20 1000 95 %     Weight --      Height --      Head Circumference --      Peak Flow --      Pain Score 06/27/20 0955 7     Pain Loc --      Pain Edu? --      Excl. in [place]? --      Constitutional: Alert and oriented. No acute distress. Pleasant and interactive Eyes: Conjunctivae are normal.  EOMI Head: Small hematoma to the right forehead Nose: No congestion/rhinnorhea. Mouth/Throat: Mucous membranes are moist.   Cardiovascular: Normal rate, regular rhythm.  Respiratory: Normal respiratory effort.  No retractions.  Musculoskeletal: Left ankle: Mild swelling, small mount of bruising to the midfoot, no bony tenderness to the forefoot or midfoot, mild discomfort to palpation along the lateral malleolus but no bony abnormalities palpated Neurologic:  Normal speech and language. No gross focal neurologic deficits are appreciated.   Skin:  Skin is warm, dry    ____________________________________________   LABS (all labs ordered are listed, but only abnormal results are displayed)  Labs Reviewed - No data to display ____________________________________________  EKG   ____________________________________________  RADIOLOGY  X-ray ankle left, reviewed by me, no acute fracture CT head without acute abnormal ____________________________________________   PROCEDURES  Procedure(s) performed: No  Procedures   Critical Care performed: No ____________________________________________   INITIAL IMPRESSION / ASSESSMENT AND PLAN / ED COURSE  Pertinent labs & imaging results that were available during  my care of the patient were reviewed by me and considered in my medical decision making (see chart for details).  Patient overall well-appearing and in no acute distress, injury occurred yesterday.  Suspect ankle sprain, will obtain x-ray to rule out  fracture.  She is concerned about head injury as well, pending CT head  X-ray CT head is reassuring, discussed results with patient.  Recommend rice, outpatient follow-up with   ____________________________________________   FINAL CLINICAL IMPRESSION(S) / ED DIAGNOSES  Final diagnoses:  Fall, initial encounter  Injury of head, initial encounter  Sprain of left ankle, unspecified ligament, initial encounter      NEW MEDICATIONS STARTED DURING THIS VISIT:  Discharge Medication List as of 06/27/2020 11:07 AM       Note:  This document was prepared using Dragon voice recognition software and may include unintentional dictation errors.   Lavonia Drafts, MD 06/27/20 1150

## 2020-06-27 NOTE — ED Triage Notes (Signed)
Pt comes with c/o fall. Pt states she was coming out of the door of a restaurant. Pt states there was a big step down and pt fell onto the pavement.   Pt state she hit her foot and head. Pt denies any blood thinners.

## 2020-06-27 NOTE — ED Notes (Signed)
Pt brought crutches from home and post op shoe. Ace wrap applied to left ankle/foot, post op shoe applied by pt. Patient demonstrated proper crutch walking for nursing staff.

## 2020-06-27 NOTE — ED Notes (Signed)
Pt presents after falling yesterday, states she missed a step while going down the steps. She denies LOC but did hit her head when she fell, denies taking any blood thinning medications. Presents today with hematoma to right forehead and c/o pain in her head and right foot and ankle.

## 2020-07-04 ENCOUNTER — Ambulatory Visit
Admission: RE | Admit: 2020-07-04 | Discharge: 2020-07-04 | Disposition: A | Discharge status: Home / Self Care | Payer: 59 | Source: Ambulatory Visit | Attending: Neurology | Admitting: Neurology

## 2020-07-04 ENCOUNTER — Other Ambulatory Visit: Payer: Self-pay

## 2020-07-04 DIAGNOSIS — G43909 Migraine, unspecified, not intractable, without status migrainosus: Secondary | ICD-10-CM

## 2020-07-04 DIAGNOSIS — E785 Hyperlipidemia, unspecified: Secondary | ICD-10-CM | POA: Diagnosis not present

## 2020-07-04 DIAGNOSIS — R519 Headache, unspecified: Secondary | ICD-10-CM | POA: Diagnosis not present

## 2020-07-04 DIAGNOSIS — R55 Syncope and collapse: Secondary | ICD-10-CM | POA: Diagnosis not present

## 2020-07-04 DIAGNOSIS — I10 Essential (primary) hypertension: Secondary | ICD-10-CM | POA: Diagnosis not present

## 2020-07-04 MED ORDER — GADOBUTROL 1 MMOL/ML IV SOLN
9.0000 mL | Freq: Once | INTRAVENOUS | Status: AC | PRN
  Administered 2020-07-04: 9 mL via INTRAVENOUS

## 2020-08-31 DIAGNOSIS — Z03818 Encounter for observation for suspected exposure to other biological agents ruled out: Secondary | ICD-10-CM | POA: Diagnosis not present

## 2020-08-31 DIAGNOSIS — Z20822 Contact with and (suspected) exposure to covid-19: Secondary | ICD-10-CM | POA: Diagnosis not present

## 2020-09-10 DIAGNOSIS — G4733 Obstructive sleep apnea (adult) (pediatric): Secondary | ICD-10-CM | POA: Diagnosis not present

## 2020-09-12 DIAGNOSIS — C44311 Basal cell carcinoma of skin of nose: Secondary | ICD-10-CM | POA: Diagnosis not present

## 2020-09-12 DIAGNOSIS — D225 Melanocytic nevi of trunk: Secondary | ICD-10-CM | POA: Diagnosis not present

## 2020-09-12 DIAGNOSIS — M79672 Pain in left foot: Secondary | ICD-10-CM | POA: Diagnosis not present

## 2020-09-12 DIAGNOSIS — D1801 Hemangioma of skin and subcutaneous tissue: Secondary | ICD-10-CM | POA: Diagnosis not present

## 2020-09-12 DIAGNOSIS — Z96661 Presence of right artificial ankle joint: Secondary | ICD-10-CM | POA: Diagnosis not present

## 2020-09-12 DIAGNOSIS — D2239 Melanocytic nevi of other parts of face: Secondary | ICD-10-CM | POA: Diagnosis not present

## 2020-09-12 DIAGNOSIS — L905 Scar conditions and fibrosis of skin: Secondary | ICD-10-CM | POA: Diagnosis not present

## 2020-09-12 DIAGNOSIS — L570 Actinic keratosis: Secondary | ICD-10-CM | POA: Diagnosis not present

## 2020-09-12 DIAGNOSIS — M84361A Stress fracture, right tibia, initial encounter for fracture: Secondary | ICD-10-CM | POA: Diagnosis not present

## 2020-09-12 DIAGNOSIS — M19171 Post-traumatic osteoarthritis, right ankle and foot: Secondary | ICD-10-CM | POA: Diagnosis not present

## 2020-09-12 DIAGNOSIS — L814 Other melanin hyperpigmentation: Secondary | ICD-10-CM | POA: Diagnosis not present

## 2020-09-12 DIAGNOSIS — D485 Neoplasm of uncertain behavior of skin: Secondary | ICD-10-CM | POA: Diagnosis not present

## 2020-09-12 DIAGNOSIS — L821 Other seborrheic keratosis: Secondary | ICD-10-CM | POA: Diagnosis not present

## 2020-09-12 DIAGNOSIS — L820 Inflamed seborrheic keratosis: Secondary | ICD-10-CM | POA: Diagnosis not present

## 2020-09-30 DIAGNOSIS — H524 Presbyopia: Secondary | ICD-10-CM | POA: Diagnosis not present

## 2020-10-05 DIAGNOSIS — Z20822 Contact with and (suspected) exposure to covid-19: Secondary | ICD-10-CM | POA: Diagnosis not present

## 2020-10-05 DIAGNOSIS — Z03818 Encounter for observation for suspected exposure to other biological agents ruled out: Secondary | ICD-10-CM | POA: Diagnosis not present

## 2020-10-13 DIAGNOSIS — G4733 Obstructive sleep apnea (adult) (pediatric): Secondary | ICD-10-CM | POA: Diagnosis not present

## 2020-10-13 DIAGNOSIS — I10 Essential (primary) hypertension: Secondary | ICD-10-CM | POA: Diagnosis not present

## 2020-10-13 DIAGNOSIS — R69 Illness, unspecified: Secondary | ICD-10-CM | POA: Diagnosis not present

## 2020-10-13 DIAGNOSIS — R0681 Apnea, not elsewhere classified: Secondary | ICD-10-CM | POA: Diagnosis not present

## 2020-10-13 IMAGING — MG DIGITAL SCREENING BILATERAL MAMMOGRAM WITH TOMO AND CAD
8 series · 8 of 24 positions shown · non-contrast
Comparison: Previous exam(s).

CLINICAL DATA: Screening.

EXAM:
DIGITAL SCREENING BILATERAL MAMMOGRAM WITH TOMO AND CAD

[R MLO synth-2D]
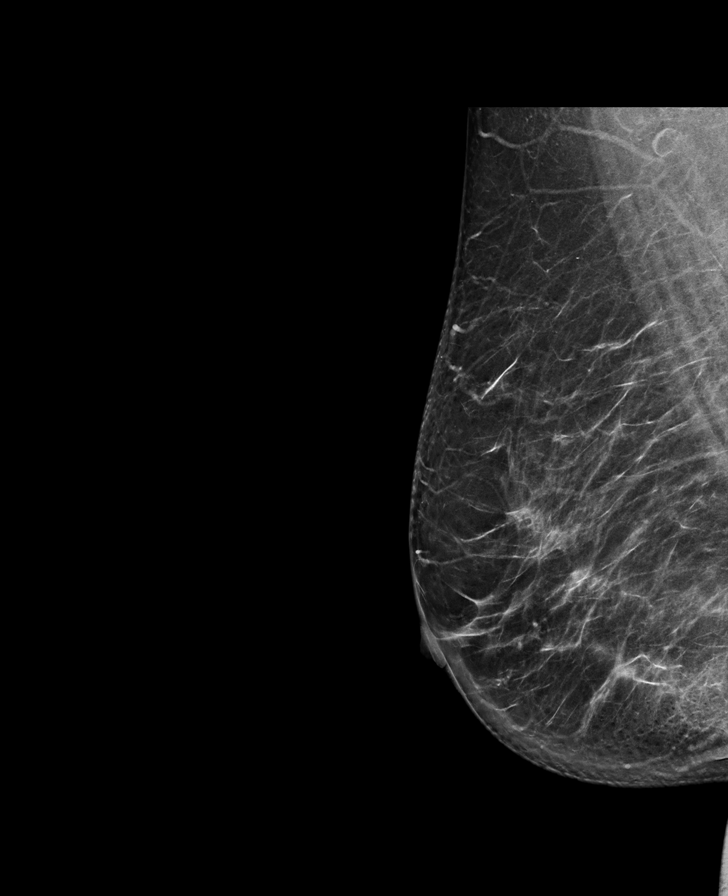

[L CC synth-2D]
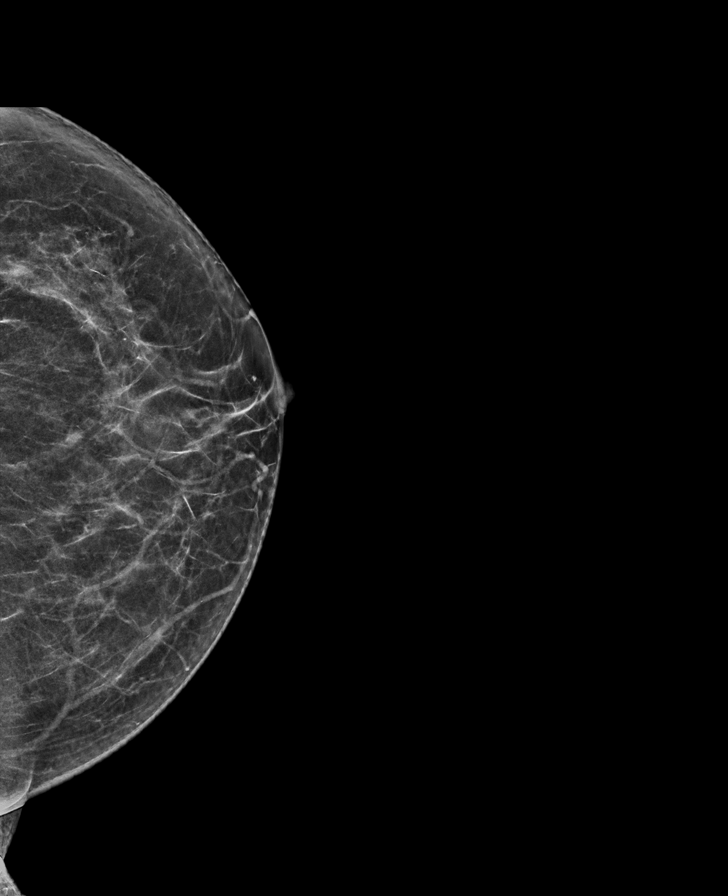

[L MLO synth-2D]
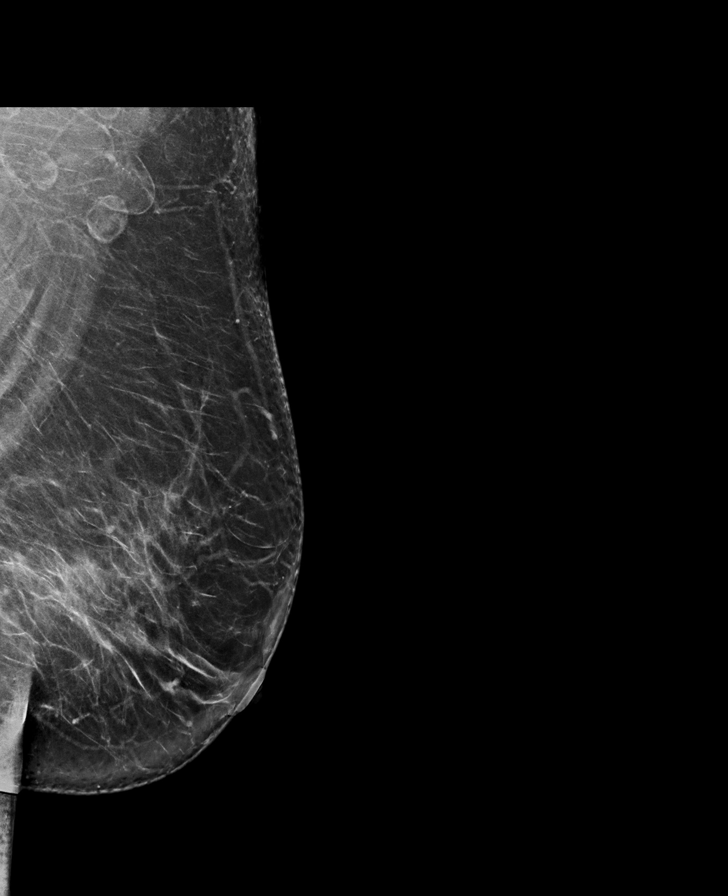

[R CC synth-2D]
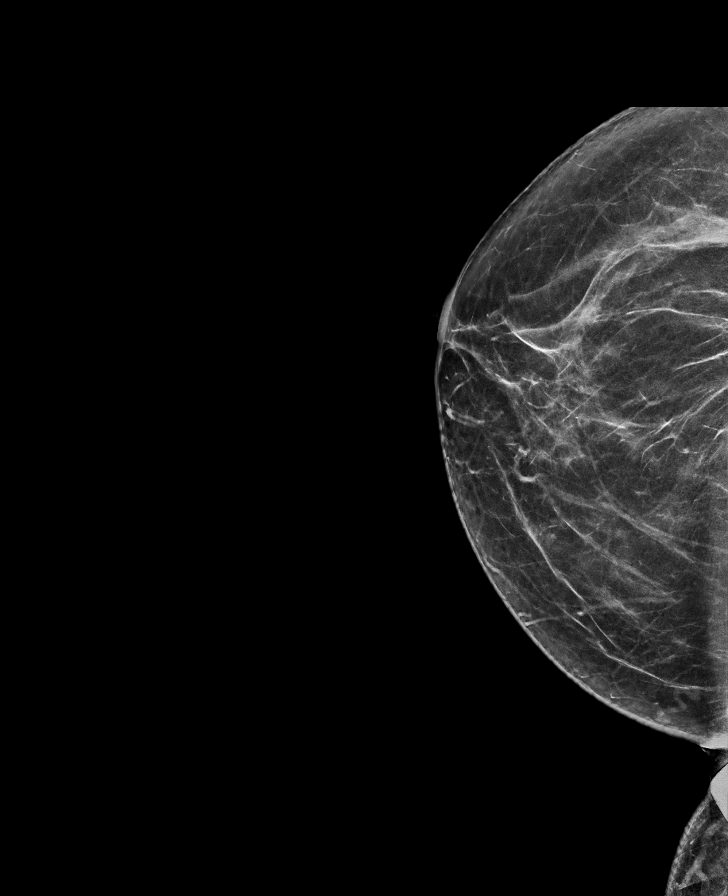

[R CC tomo · tomo slice 35/70.0]
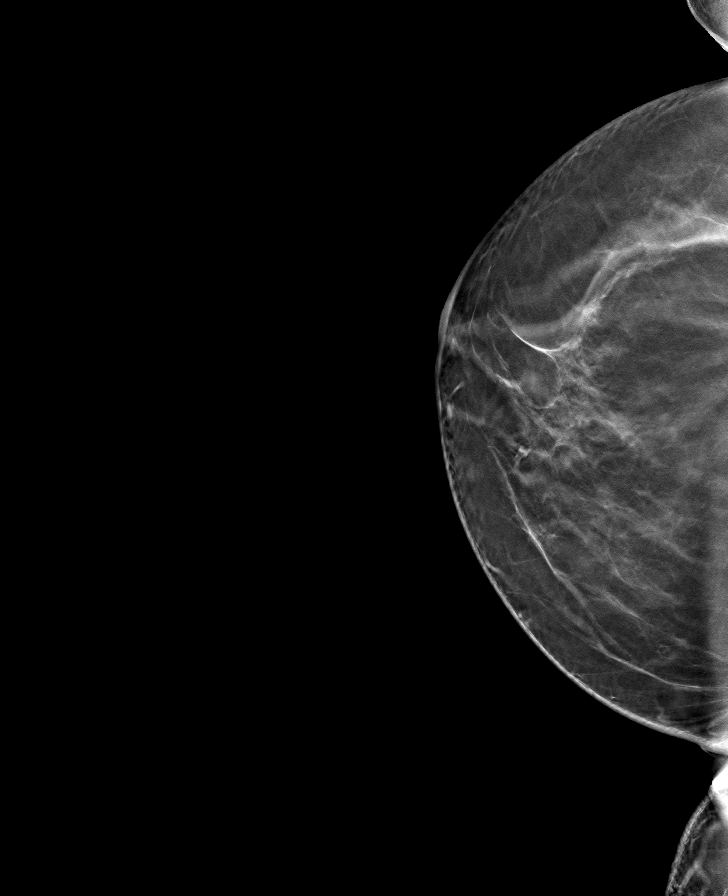

[L MLO tomo · tomo slice 44/87.0]
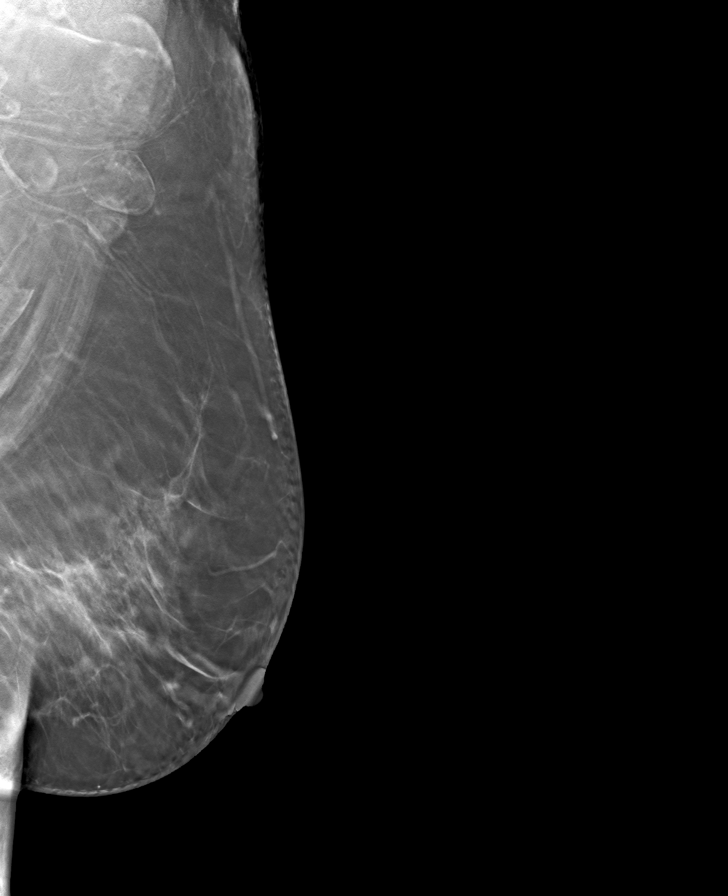

[L CC tomo · tomo slice 31/61.0]
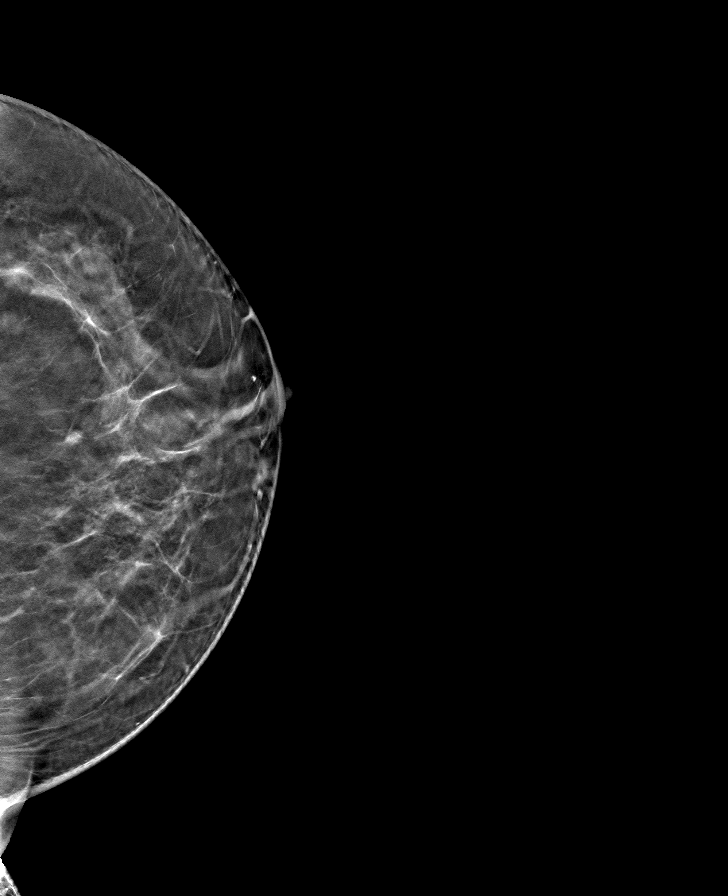

[R MLO tomo · tomo slice 39/77.0]
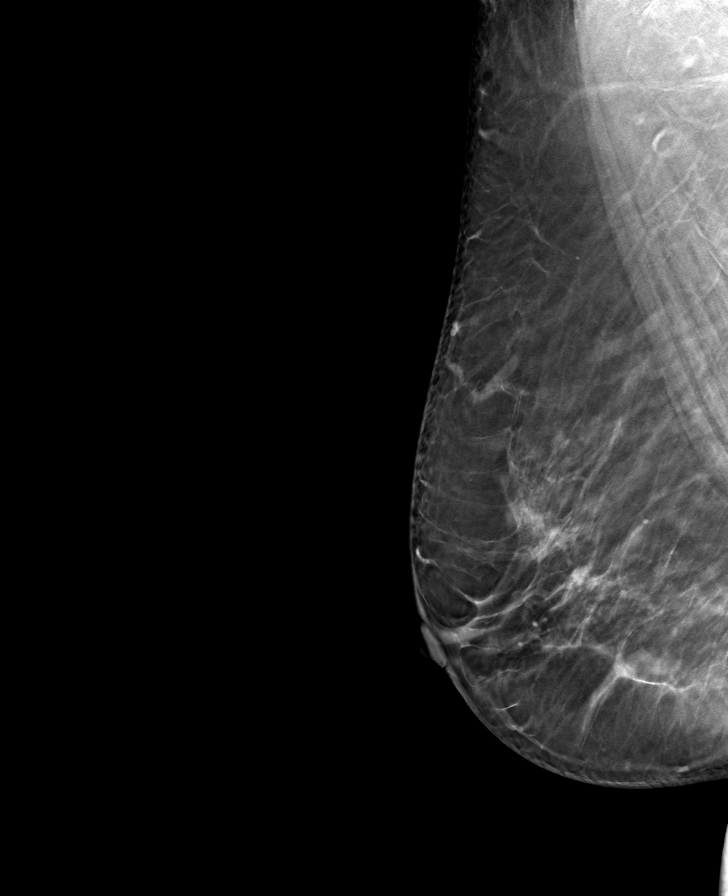

[8 of 24 positions shown; findings below may reference images not displayed]

ACR Breast Density Category b: There are scattered areas of
fibroglandular density.
FINDINGS: In the left breast, a possible mass warrants further evaluation. In
the right breast, no findings suspicious for malignancy.

Images were processed with CAD.
IMPRESSION: Further evaluation is suggested for possible mass in the left
breast.

RECOMMENDATION:
Diagnostic mammogram and possibly ultrasound of the left breast.
(Code:JC-2-SSL)

The patient will be contacted regarding the findings, and additional
imaging will be scheduled.

BI-RADS CATEGORY  0: Incomplete. Need additional imaging evaluation
and/or prior mammograms for comparison.

## 2020-10-18 DIAGNOSIS — M84361A Stress fracture, right tibia, initial encounter for fracture: Secondary | ICD-10-CM | POA: Diagnosis not present

## 2020-10-18 DIAGNOSIS — M19171 Post-traumatic osteoarthritis, right ankle and foot: Secondary | ICD-10-CM | POA: Diagnosis not present

## 2020-10-18 DIAGNOSIS — Z96661 Presence of right artificial ankle joint: Secondary | ICD-10-CM | POA: Diagnosis not present

## 2020-10-18 DIAGNOSIS — M79672 Pain in left foot: Secondary | ICD-10-CM | POA: Diagnosis not present

## 2020-11-13 DIAGNOSIS — R69 Illness, unspecified: Secondary | ICD-10-CM | POA: Diagnosis not present

## 2020-11-13 DIAGNOSIS — I10 Essential (primary) hypertension: Secondary | ICD-10-CM | POA: Diagnosis not present

## 2020-11-13 DIAGNOSIS — G4733 Obstructive sleep apnea (adult) (pediatric): Secondary | ICD-10-CM | POA: Diagnosis not present

## 2020-11-13 DIAGNOSIS — R0681 Apnea, not elsewhere classified: Secondary | ICD-10-CM | POA: Diagnosis not present

## 2020-11-14 DIAGNOSIS — I10 Essential (primary) hypertension: Secondary | ICD-10-CM | POA: Diagnosis not present

## 2020-11-14 DIAGNOSIS — E119 Type 2 diabetes mellitus without complications: Secondary | ICD-10-CM | POA: Diagnosis not present

## 2020-11-14 DIAGNOSIS — R69 Illness, unspecified: Secondary | ICD-10-CM | POA: Diagnosis not present

## 2020-11-14 DIAGNOSIS — E782 Mixed hyperlipidemia: Secondary | ICD-10-CM | POA: Diagnosis not present

## 2020-11-14 DIAGNOSIS — Z23 Encounter for immunization: Secondary | ICD-10-CM | POA: Diagnosis not present

## 2020-11-16 DIAGNOSIS — Z20822 Contact with and (suspected) exposure to covid-19: Secondary | ICD-10-CM | POA: Diagnosis not present

## 2020-11-16 DIAGNOSIS — Z03818 Encounter for observation for suspected exposure to other biological agents ruled out: Secondary | ICD-10-CM | POA: Diagnosis not present

## 2020-11-22 DIAGNOSIS — E538 Deficiency of other specified B group vitamins: Secondary | ICD-10-CM | POA: Diagnosis not present

## 2020-11-22 DIAGNOSIS — I10 Essential (primary) hypertension: Secondary | ICD-10-CM | POA: Diagnosis not present

## 2020-11-22 DIAGNOSIS — G2581 Restless legs syndrome: Secondary | ICD-10-CM | POA: Diagnosis not present

## 2020-11-22 DIAGNOSIS — Z79899 Other long term (current) drug therapy: Secondary | ICD-10-CM | POA: Diagnosis not present

## 2020-11-22 DIAGNOSIS — E119 Type 2 diabetes mellitus without complications: Secondary | ICD-10-CM | POA: Diagnosis not present

## 2020-11-22 DIAGNOSIS — E782 Mixed hyperlipidemia: Secondary | ICD-10-CM | POA: Diagnosis not present

## 2020-11-22 DIAGNOSIS — E559 Vitamin D deficiency, unspecified: Secondary | ICD-10-CM | POA: Diagnosis not present

## 2020-11-22 DIAGNOSIS — E669 Obesity, unspecified: Secondary | ICD-10-CM | POA: Diagnosis not present

## 2020-12-12 DIAGNOSIS — C4491 Basal cell carcinoma of skin, unspecified: Secondary | ICD-10-CM | POA: Diagnosis not present

## 2020-12-12 DIAGNOSIS — Z481 Encounter for planned postprocedural wound closure: Secondary | ICD-10-CM | POA: Diagnosis not present

## 2020-12-12 DIAGNOSIS — C44311 Basal cell carcinoma of skin of nose: Secondary | ICD-10-CM | POA: Diagnosis not present

## 2020-12-13 DIAGNOSIS — R69 Illness, unspecified: Secondary | ICD-10-CM | POA: Diagnosis not present

## 2020-12-13 DIAGNOSIS — I10 Essential (primary) hypertension: Secondary | ICD-10-CM | POA: Diagnosis not present

## 2020-12-13 DIAGNOSIS — R0681 Apnea, not elsewhere classified: Secondary | ICD-10-CM | POA: Diagnosis not present

## 2020-12-13 DIAGNOSIS — G4733 Obstructive sleep apnea (adult) (pediatric): Secondary | ICD-10-CM | POA: Diagnosis not present

## 2020-12-22 DIAGNOSIS — Z20822 Contact with and (suspected) exposure to covid-19: Secondary | ICD-10-CM | POA: Diagnosis not present

## 2020-12-22 DIAGNOSIS — Z03818 Encounter for observation for suspected exposure to other biological agents ruled out: Secondary | ICD-10-CM | POA: Diagnosis not present

## 2021-01-11 DIAGNOSIS — G4733 Obstructive sleep apnea (adult) (pediatric): Secondary | ICD-10-CM | POA: Diagnosis not present

## 2021-01-11 DIAGNOSIS — I10 Essential (primary) hypertension: Secondary | ICD-10-CM | POA: Diagnosis not present

## 2021-01-13 DIAGNOSIS — I10 Essential (primary) hypertension: Secondary | ICD-10-CM | POA: Diagnosis not present

## 2021-01-13 DIAGNOSIS — R0681 Apnea, not elsewhere classified: Secondary | ICD-10-CM | POA: Diagnosis not present

## 2021-01-13 DIAGNOSIS — R69 Illness, unspecified: Secondary | ICD-10-CM | POA: Diagnosis not present

## 2021-01-13 DIAGNOSIS — G4733 Obstructive sleep apnea (adult) (pediatric): Secondary | ICD-10-CM | POA: Diagnosis not present

## 2021-02-06 DIAGNOSIS — R197 Diarrhea, unspecified: Secondary | ICD-10-CM | POA: Diagnosis not present

## 2021-02-10 DIAGNOSIS — I10 Essential (primary) hypertension: Secondary | ICD-10-CM | POA: Diagnosis not present

## 2021-02-10 DIAGNOSIS — G4733 Obstructive sleep apnea (adult) (pediatric): Secondary | ICD-10-CM | POA: Diagnosis not present

## 2021-02-10 DIAGNOSIS — R0681 Apnea, not elsewhere classified: Secondary | ICD-10-CM | POA: Diagnosis not present

## 2021-02-12 DIAGNOSIS — G4733 Obstructive sleep apnea (adult) (pediatric): Secondary | ICD-10-CM | POA: Diagnosis not present

## 2021-02-12 DIAGNOSIS — R0681 Apnea, not elsewhere classified: Secondary | ICD-10-CM | POA: Diagnosis not present

## 2021-02-12 DIAGNOSIS — I10 Essential (primary) hypertension: Secondary | ICD-10-CM | POA: Diagnosis not present

## 2021-02-12 DIAGNOSIS — R69 Illness, unspecified: Secondary | ICD-10-CM | POA: Diagnosis not present

## 2021-02-17 DIAGNOSIS — R1084 Generalized abdominal pain: Secondary | ICD-10-CM | POA: Diagnosis not present

## 2021-02-17 DIAGNOSIS — R194 Change in bowel habit: Secondary | ICD-10-CM | POA: Diagnosis not present

## 2021-02-17 DIAGNOSIS — R197 Diarrhea, unspecified: Secondary | ICD-10-CM | POA: Diagnosis not present

## 2021-02-17 DIAGNOSIS — R152 Fecal urgency: Secondary | ICD-10-CM | POA: Diagnosis not present

## 2023-02-19 ENCOUNTER — Other Ambulatory Visit: Payer: Self-pay | Admitting: Physician Assistant

## 2023-02-19 DIAGNOSIS — Z8249 Family history of ischemic heart disease and other diseases of the circulatory system: Secondary | ICD-10-CM

## 2023-03-07 ENCOUNTER — Ambulatory Visit
Admission: RE | Admit: 2023-03-07 | Discharge: 2023-03-07 | Disposition: A | Discharge status: Home / Self Care | Payer: Self-pay | Source: Ambulatory Visit | Attending: Physician Assistant | Admitting: Physician Assistant

## 2023-03-07 DIAGNOSIS — Z8249 Family history of ischemic heart disease and other diseases of the circulatory system: Secondary | ICD-10-CM | POA: Insufficient documentation

## 2023-07-05 ENCOUNTER — Other Ambulatory Visit: Payer: Self-pay | Admitting: Neurology

## 2023-07-05 DIAGNOSIS — G3184 Mild cognitive impairment, so stated: Secondary | ICD-10-CM

## 2023-07-08 ENCOUNTER — Ambulatory Visit
Admission: RE | Admit: 2023-07-08 | Discharge: 2023-07-08 | Disposition: A | Discharge status: Home / Self Care | Source: Ambulatory Visit | Attending: Neurology | Admitting: Neurology

## 2023-07-08 DIAGNOSIS — G3184 Mild cognitive impairment, so stated: Secondary | ICD-10-CM | POA: Diagnosis present

## 2024-01-08 ENCOUNTER — Other Ambulatory Visit: Payer: Self-pay

## 2024-01-08 ENCOUNTER — Emergency Department
Admission: EM | Admit: 2024-01-08 | Discharge: 2024-01-08 | Discharge status: Against Medical Advice | Source: Ambulatory Visit | Attending: Emergency Medicine | Admitting: Emergency Medicine

## 2024-01-08 DIAGNOSIS — F410 Panic disorder [episodic paroxysmal anxiety] without agoraphobia: Secondary | ICD-10-CM | POA: Diagnosis present

## 2024-01-08 DIAGNOSIS — Z5321 Procedure and treatment not carried out due to patient leaving prior to being seen by health care provider: Secondary | ICD-10-CM | POA: Insufficient documentation

## 2024-01-08 HISTORY — DX: Anxiety disorder, unspecified: F41.9

## 2024-01-08 NOTE — ED Triage Notes (Signed)
 Patient states she's been having intermittent panic attacks over the past month but they worsened last night; patient reports life stressors that have been happening recently. Patient denies SI/HI.
